# Patient Record
Sex: Female | Born: 1985
Health system: Southern US, Community
[De-identification: ages and names within clinical notes are randomized; demographics above are authoritative.]

## PROBLEM LIST (undated history)

## (undated) DIAGNOSIS — J45909 Unspecified asthma, uncomplicated: Secondary | ICD-10-CM

## (undated) DIAGNOSIS — D649 Anemia, unspecified: Secondary | ICD-10-CM

## (undated) DIAGNOSIS — F419 Anxiety disorder, unspecified: Secondary | ICD-10-CM

## (undated) DIAGNOSIS — M797 Fibromyalgia: Secondary | ICD-10-CM

## (undated) DIAGNOSIS — E079 Disorder of thyroid, unspecified: Secondary | ICD-10-CM

## (undated) DIAGNOSIS — Z8742 Personal history of other diseases of the female genital tract: Secondary | ICD-10-CM

## (undated) HISTORY — DX: Unspecified asthma, uncomplicated: J45.909

## (undated) HISTORY — DX: Personal history of other diseases of the female genital tract: Z87.42

## (undated) HISTORY — PX: APPENDECTOMY: SHX54

## (undated) HISTORY — PX: ABDOMINAL HYSTERECTOMY: SHX81

## (undated) HISTORY — DX: Fibromyalgia: M79.7

## (undated) HISTORY — PX: CHOLECYSTECTOMY: SHX55

## (undated) HISTORY — DX: Anxiety disorder, unspecified: F41.9

## (undated) HISTORY — PX: TONSILLECTOMY AND ADENOIDECTOMY: SUR1326

## (undated) HISTORY — PX: ANKLE SURGERY: SHX546

## (undated) HISTORY — DX: Anemia, unspecified: D64.9

## (undated) HISTORY — DX: Disorder of thyroid, unspecified: E07.9

---

## 2015-09-04 NOTE — L&D Delivery Note (Signed)
Delivery Note At 1:01 PM a viable and healthy female was delivered via Vaginal, Spontaneous Delivery (Presentation: direct OA).  Shoulder dystocia lasting approximately 40 seconds resolved with McRobert's and modified Woods;  APGAR: 9, 9; weight:  9lb 2 oz .   Placenta status: intact ,   Anesthesia:  Epidural Episiotomy: None Lacerations: 2nd degree Suture Repair: 3.0 Monocryl Est. Blood Loss (mL):  400  Mom to postpartum.  Baby to Couplet care / Skin to Skin.  Eino FarberWalidah Elenora FenderKarim 08/27/2016, 1:48 PM

## 2015-09-19 DIAGNOSIS — M62838 Other muscle spasm: Secondary | ICD-10-CM | POA: Insufficient documentation

## 2015-09-19 DIAGNOSIS — M797 Fibromyalgia: Secondary | ICD-10-CM | POA: Insufficient documentation

## 2015-09-23 ENCOUNTER — Telehealth: Payer: Self-pay | Admitting: Endocrinology

## 2015-09-23 ENCOUNTER — Ambulatory Visit (INDEPENDENT_AMBULATORY_CARE_PROVIDER_SITE_OTHER): Payer: BLUE CROSS/BLUE SHIELD | Admitting: Endocrinology

## 2015-09-23 ENCOUNTER — Encounter: Payer: Self-pay | Admitting: Endocrinology

## 2015-09-23 VITALS — BP 136/94 | HR 99 | Temp 98.4°F | Ht 64.5 in | Wt 243.0 lb

## 2015-09-23 DIAGNOSIS — E282 Polycystic ovarian syndrome: Secondary | ICD-10-CM | POA: Diagnosis not present

## 2015-09-23 DIAGNOSIS — E063 Autoimmune thyroiditis: Secondary | ICD-10-CM | POA: Diagnosis not present

## 2015-09-23 MED ORDER — LIRAGLUTIDE 18 MG/3ML ~~LOC~~ SOPN: 1.8000 mg | PEN_INJECTOR | Freq: Every day | SUBCUTANEOUS | Status: DC

## 2015-09-23 MED ORDER — METFORMIN HCL ER 500 MG PO TB24: 500.0000 mg | ORAL_TABLET | Freq: Every day | ORAL | Status: DC

## 2015-09-23 NOTE — Patient Instructions (Addendum)
Please sign a release of information from your previous endocrinologist.   If necessary, we can request another thyroid ultrasound.  Because of the Hasimoto's condition, you are at risk for abnormal thyroid blood tests, so you should have checked periodically.  In view of your medical condition, you should avoid pregnancy until we have decided it is safe You should take "victoza" pen, once a day.  The side-effect is nausea, which goes away with time.  To avoid this side-effect, start with the lowest (0.6) setting.  After a few days, increase to 1.2.  If you still have little or no nausea, increase to the highest (1.8) setting, and continue that setting.  Here is a discount card.   Other options are invokana and/or metformin.  We can consider these as we need to.   Please come back for a follow-up appointment in 3 months.

## 2015-09-23 NOTE — Progress Notes (Signed)
Subjective:    Patient ID: Shannon Mckee, female    DOB: [birth year], 30 y.o.   MRN: [id number]  HPI Pt had menarche at age 57.  She has usually had irregular menses over the years. She is G2P2 (2013 (had GDM) and 2016--did not require induction of ovulation either time).  She does not want a pregnancy now, but would like to preserve fertility for the future.  She has tried many types of oral contraceptives, and did not tolerate.  She took depo-provera as a teenager, but stopped due to severe weight gain.  She has mild acne on the face, but no assoc hirsutism. She was started on provera a few days ago.   No past medical history on file.  No past surgical history on file.  Social History   Social History  . Marital Status: Married    Spouse Name: N/A  . Number of Children: N/A  . Years of Education: N/A   Occupational History  . Not on file.   Social History Main Topics  . Smoking status: Never Smoker   . Smokeless tobacco: Not on file  . Alcohol Use: No  . Drug Use: Not on file  . Sexual Activity: Not on file   Other Topics Concern  . Not on file   Social History Narrative  . No narrative on file    No current outpatient prescriptions on file prior to visit.   No current facility-administered medications on file prior to visit.    Allergies  Allergen Reactions  . Aspirin Palpitations  . Cefixime Anaphylaxis    Unknown reaction  . Sulfa Antibiotics Hives and Rash    Palpitations  . Latex     Hive Palpitations     No family history on file.  BP 136/94 mmHg  Pulse 99  Temp(Src) 98.4 F (36.9 C) (Oral)  Ht 5' 4.5" (1.638 m)  Wt 243 lb (110.224 kg)  BMI 41.08 kg/m2  SpO2 98%  LMP 07/05/2015  Review of Systems denies hyperpigmentation, cramps, and muscle weakness.  She reports alternating heat and cold intolerance, lightheadedness, chest pain, myalgias, mood swings, headache, fatigue, blurry vision, excessive diaphoresis, hair loss, doe, urinary frequency,  acral numbness, easy bruising, depression, and palpitations.      Objective:   Physical Exam VS: see vs page GEN: no distress HEAD: head: no deformity eyes: no periorbital swelling, no proptosis external nose and ears are normal mouth: no lesion seen NECK: supple, thyroid is not enlarged CHEST WALL: no deformity LUNGS:  Clear to auscultation CV: reg rate and rhythm, no murmur.  ABD: abdomen is soft, nontender.  no hepatosplenomegaly.  not distended.  no hernia.   MUSCULOSKELETAL: muscle bulk and strength are grossly normal.  no obvious joint swelling.  gait is normal and steady EXTEMITIES: no deformity.  no ulcer on the feet.  feet are of normal color and temp.  no edema PULSES: dorsalis pedis intact bilat.  no carotid bruit NEURO:  cn 2-12 grossly intact.   readily moves all 4's.  sensation is intact to touch on the feet SKIN:  Normal texture and temperature.  No rash or suspicious lesion is visible.   NODES:  None palpable at the neck PSYCH: alert, well-oriented.  Does not appear anxious nor depressed.    outside test results are reviewed: TSH=2  I have reviewed outside records, and summarized: Pt was noted to have PCO, and referred here.     Assessment & Plan:  Hasimoto's thyroiditis, new to  me.  PCO: she is at risk for DM.  Thyroid nodule,reported by patient.   Patient is advised the following: Patient Instructions  Please sign a release of information from your previous endocrinologist.   If necessary, we can request another thyroid ultrasound.  Because of the Hasimoto's condition, you are at risk for abnormal thyroid blood tests, so you should have checked periodically.  In view of your medical condition, you should avoid pregnancy until we have decided it is safe You should take "victoza" pen, once a day.  The side-effect is nausea, which goes away with time.  To avoid this side-effect, start with the lowest (0.6) setting.  After a few days, increase to 1.2.  If you  still have little or no nausea, increase to the highest (1.8) setting, and continue that setting.  Here is a discount card.   Other options are invokana and/or metformin.  We can consider these as we need to.   Please come back for a follow-up appointment in 3 months.

## 2015-09-23 NOTE — Telephone Encounter (Signed)
please call patient: Ins declined victoza. Instead, i have sent a prescription to your pharmacy, for metformin Please come back for a follow-up appointment in 3 months

## 2015-09-23 NOTE — Telephone Encounter (Signed)
Left a voicemail advising of note below. Requested a call back if the pt would like to discuss.  

## 2015-12-22 ENCOUNTER — Ambulatory Visit: Payer: BLUE CROSS/BLUE SHIELD | Admitting: Endocrinology

## 2015-12-29 ENCOUNTER — Encounter: Payer: Self-pay | Admitting: Certified Nurse Midwife

## 2015-12-29 ENCOUNTER — Ambulatory Visit (INDEPENDENT_AMBULATORY_CARE_PROVIDER_SITE_OTHER): Payer: Medicaid Other | Admitting: Certified Nurse Midwife

## 2015-12-29 VITALS — BP 134/81 | HR 97 | Wt 244.0 lb

## 2015-12-29 DIAGNOSIS — Z01419 Encounter for gynecological examination (general) (routine) without abnormal findings: Secondary | ICD-10-CM

## 2015-12-29 DIAGNOSIS — Z0000 Encounter for general adult medical examination without abnormal findings: Secondary | ICD-10-CM | POA: Diagnosis not present

## 2015-12-29 DIAGNOSIS — Z3202 Encounter for pregnancy test, result negative: Secondary | ICD-10-CM | POA: Diagnosis not present

## 2015-12-29 DIAGNOSIS — Z1389 Encounter for screening for other disorder: Secondary | ICD-10-CM | POA: Diagnosis not present

## 2015-12-29 DIAGNOSIS — N926 Irregular menstruation, unspecified: Secondary | ICD-10-CM

## 2015-12-29 DIAGNOSIS — E063 Autoimmune thyroiditis: Secondary | ICD-10-CM

## 2015-12-29 DIAGNOSIS — Z3481 Encounter for supervision of other normal pregnancy, first trimester: Secondary | ICD-10-CM

## 2015-12-29 DIAGNOSIS — Z7689 Persons encountering health services in other specified circumstances: Secondary | ICD-10-CM

## 2015-12-29 LAB — POCT URINALYSIS DIPSTICK
Bilirubin, UA: NEGATIVE
Glucose, UA: NEGATIVE
Ketones, UA: NEGATIVE
Nitrite, UA: NEGATIVE
PH UA: 6
PROTEIN UA: NEGATIVE
RBC UA: NEGATIVE
SPEC GRAV UA: 1.015
UROBILINOGEN UA: NEGATIVE

## 2015-12-29 LAB — POCT URINE PREGNANCY: PREG TEST UR: POSITIVE — AB

## 2015-12-29 NOTE — Progress Notes (Signed)
Patient ID: Shannon Mckee, female   DOB: 1985-09-19, 30 y.o.   MRN: [id number]    Subjective:      Shannon Mckee is a 30 y.o. female here for a routine exam.  Current complaints: recent pregnancy.  Recently moved from [place].  Husband recently changed jobs.  Thinks she is about [redacted] weeks pregnant.  LMP: March 8th.    Personal health questionnaire:  Is patient Ashkenazi Jewish, have a family history of breast and/or ovarian cancer: no Is there a family history of uterine cancer diagnosed at age < 62, gastrointestinal cancer, urinary tract cancer, family member who is a Personnel officer syndrome-associated carrier: no Is the patient overweight and hypertensive, family history of diabetes, personal history of gestational diabetes, preeclampsia or PCOS: yes Is patient over 75, have PCOS,  family history of premature CHD under age 44, diabetes, smoke, have hypertension or peripheral artery disease:  yes At any time, has a partner hit, kicked or otherwise hurt or frightened you?: no Over the past 2 weeks, have you felt down, depressed or hopeless?: no, struggled in the past Over the past 2 weeks, have you felt little interest or pleasure in doing things?:no   Gynecologic History No LMP recorded. Patient is not currently having periods (Reason: Other). Contraception: none Last Pap: Summer 2016. Results were: normal according to the patient Last mammogram: N/A.   Obstetric History OB History  No data available  G3P2, term.    Past Medical History  Diagnosis Date  . Anemia     Pregnancy related  . Anxiety   . Asthma   . History of PCOS   . Fibromyalgia   . Thyroid disease     Past Surgical History  Procedure Laterality Date  . Cholecystectomy    . Appendectomy    . Tonsillectomy and adenoidectomy    . Ankle surgery      x 2      Current outpatient prescriptions:  .  ibuprofen (ADVIL,MOTRIN) 400 MG tablet, Take 400 mg by mouth every 6 (six) hours as needed., Disp: , Rfl:  .  Vitamin D,  Ergocalciferol, (DRISDOL) 50000 units CAPS capsule, TK ONE C PO  WEEKLY, Disp: , Rfl: 0 Allergies  Allergen Reactions  . Aspirin Palpitations  . Cefixime Anaphylaxis    Unknown reaction  . Sulfa Antibiotics Hives and Rash    Palpitations  . Latex     Hive Palpitations     Social History  Substance Use Topics  . Smoking status: Never Smoker   . Smokeless tobacco: Not on file  . Alcohol Use: No    History reviewed. No pertinent family history.    Review of Systems  Constitutional: negative for fatigue and weight loss Respiratory: negative for cough and wheezing Cardiovascular: negative for chest pain, fatigue and palpitations Gastrointestinal: negative for abdominal pain and change in bowel habits Musculoskeletal:negative for myalgias Neurological: negative for gait problems and tremors Behavioral/Psych: negative for abusive relationship, depression Endocrine: negative for temperature intolerance   Genitourinary:negative for abnormal menstrual periods, genital lesions, hot flashes, sexual problems and vaginal discharge Integument/breast: negative for breast lump, breast tenderness, nipple discharge and skin lesion(s)    Objective:       BP 134/81 mmHg  Pulse 97  Wt 244 lb (110.678 kg) General:   alert  Skin:   no rash or abnormalities  Lungs:   clear to auscultation bilaterally  Heart:   regular rate and rhythm, S1, S2 normal, no murmur, click, rub or gallop  Breasts:  normal without suspicious masses, skin or nipple changes or axillary nodes  Abdomen:  normal findings: no organomegaly, soft, non-tender and no hernia  Pelvis:  External genitalia: normal general appearance Urinary system: urethral meatus normal and bladder without fullness, nontender Vaginal: normal without tenderness, induration or masses Cervix: normal appearance Adnexa: normal bimanual exam Uterus: anteverted and non-tender, about 7 weeks in size   Lab Review Urine pregnancy test Labs reviewed  yes Radiologic studies reviewed no  50% of 30 min visit spent on counseling and coordination of care.   Assessment:    Healthy female exam.   +UPT: roughly [redacted] weeks pregnant  H/O Hashimoto's thyroiditis  Obesity  Close spaced pregnancies  H/O PCOS  Plan:    Education reviewed: calcium supplements, depression evaluation, low fat, low cholesterol diet, safe sex/STD prevention, self breast exams, skin cancer screening and weight bearing exercise. Contraception: none. Follow up in: 3 weeks for NOB appointment.   No orders of the defined types were placed in this encounter.   Orders Placed This Encounter  Procedures  . US MFM Fetal Nuchal Translucency    Standing Status: Future     Number of Occurrences:      Standing Expiration Date: 02/27/2017    Scheduling Instructions:     Roughly 7 weeks today.    Order Specific Question:  Reason for Exam (SYMPTOM  OR DIAGNOSIS REQUIRED)    Answer:  hashimotos thyroditis, PCOS    Order Specific Question:  Preferred imaging location?    Answer:  [facility]  . Ambulatory referral to Endocrinology    Referral Priority:  Routine    Referral Type:  Consultation    Referral Reason:  Specialty Services Required    Number of Visits Requested:  1  . POCT urinalysis dipstick  . POCT urine pregnancy   Need to obtain previous records

## 2015-12-29 NOTE — Addendum Note (Signed)
Addended by: Marya LandryFOSTER, SUZANNE D on: 12/29/2015 02:36 PM   Modules accepted: Orders

## 2016-01-01 LAB — PAP IG W/ RFLX HPV ASCU: PAP SMEAR COMMENT: 0

## 2016-01-01 LAB — NUSWAB VAGINITIS PLUS (VG+)
Atopobium vaginae: HIGH Score — AB
CANDIDA ALBICANS, NAA: NEGATIVE
CANDIDA GLABRATA, NAA: NEGATIVE
Chlamydia trachomatis, NAA: NEGATIVE
Megasphaera 1: HIGH Score — AB
Neisseria gonorrhoeae, NAA: NEGATIVE
TRICH VAG BY NAA: NEGATIVE

## 2016-01-03 ENCOUNTER — Other Ambulatory Visit: Payer: Self-pay | Admitting: Certified Nurse Midwife

## 2016-01-03 DIAGNOSIS — N760 Acute vaginitis: Principal | ICD-10-CM

## 2016-01-03 DIAGNOSIS — B9689 Other specified bacterial agents as the cause of diseases classified elsewhere: Secondary | ICD-10-CM

## 2016-01-03 MED ORDER — METRONIDAZOLE 500 MG PO TABS: 500.0000 mg | ORAL_TABLET | Freq: Two times a day (BID) | ORAL | Status: DC

## 2016-01-16 ENCOUNTER — Encounter: Payer: Self-pay | Admitting: Endocrinology

## 2016-01-16 ENCOUNTER — Ambulatory Visit (INDEPENDENT_AMBULATORY_CARE_PROVIDER_SITE_OTHER): Payer: BLUE CROSS/BLUE SHIELD | Admitting: Endocrinology

## 2016-01-16 VITALS — BP 138/82 | HR 70 | Temp 98.5°F | Ht 64.5 in | Wt 243.0 lb

## 2016-01-16 DIAGNOSIS — E559 Vitamin D deficiency, unspecified: Secondary | ICD-10-CM | POA: Diagnosis not present

## 2016-01-16 DIAGNOSIS — E063 Autoimmune thyroiditis: Secondary | ICD-10-CM | POA: Diagnosis not present

## 2016-01-16 DIAGNOSIS — E041 Nontoxic single thyroid nodule: Secondary | ICD-10-CM | POA: Diagnosis not present

## 2016-01-16 DIAGNOSIS — E282 Polycystic ovarian syndrome: Secondary | ICD-10-CM | POA: Diagnosis not present

## 2016-01-16 LAB — HEMOGLOBIN A1C: HEMOGLOBIN A1C: 5.4 % (ref 4.6–6.5)

## 2016-01-16 LAB — TSH: TSH: 1.92 u[IU]/mL (ref 0.35–4.50)

## 2016-01-16 LAB — VITAMIN D 25 HYDROXY (VIT D DEFICIENCY, FRACTURES): VITD: 26.58 ng/mL — ABNORMAL LOW (ref 30.00–100.00)

## 2016-01-16 NOTE — Progress Notes (Signed)
Subjective:    Patient ID: Shannon DecampYolanda Mckee, female    DOB: 26-Apr-1986, 30 y.o.   MRN: [id number]  HPI  The state of at least three ongoing medical problems is addressed today, with interval history of each noted here: Pt returns for f/u chronic primary hypothyroidism: She took synthroid during a 2015-2016 pregnancy.  She has slight dry skin. Pt also has PCO (she is G2P2 (2013 (had GDM) and 2016--she did not require induction of ovulation either time).  She is now [redacted] weeks pregnant.  She has nausea. She was also noted to have a thyroid nodule in the past: was too small to require bx.  Denies neck swelling.  Past Medical History  Diagnosis Date  . Anemia     Pregnancy related  . Anxiety   . Asthma   . History of PCOS   . Fibromyalgia   . Thyroid disease     Past Surgical History  Procedure Laterality Date  . Cholecystectomy    . Appendectomy    . Tonsillectomy and adenoidectomy    . Ankle surgery      x 2     Social History   Social History  . Marital Status: Married    Spouse Name: N/A  . Number of Children: N/A  . Years of Education: N/A   Occupational History  . Not on file.   Social History Main Topics  . Smoking status: Never Smoker   . Smokeless tobacco: Not on file  . Alcohol Use: No  . Drug Use: Not on file  . Sexual Activity: Yes   Other Topics Concern  . Not on file   Social History Narrative    Current Outpatient Prescriptions on File Prior to Visit  Medication Sig Dispense Refill  . Vitamin D, Ergocalciferol, (DRISDOL) 50000 units CAPS capsule TK ONE C PO  WEEKLY  0   No current facility-administered medications on file prior to visit.    Allergies  Allergen Reactions  . Aspirin Palpitations  . Cefixime Anaphylaxis    Unknown reaction  . Sulfa Antibiotics Hives and Rash    Palpitations  . Latex     Hive Palpitations     No family history on file.  BP 138/82 mmHg  Pulse 70  Temp(Src) 98.5 F (36.9 C) (Oral)  Ht 5' 4.5" (1.638 m)   Wt 243 lb (110.224 kg)  BMI 41.08 kg/m2  SpO2 95%     Review of Systems Denies vomiting and weight change.      Objective:   Physical Exam VITAL SIGNS:  See vs page GENERAL: no distress NECK: No thyroid nodule is palpable.  No palpable lymphadenopathy at the anterior neck.   Lab Results  Component Value Date   TSH 1.92 01/16/2016   Lab Results  Component Value Date   HGBA1C 5.4 01/16/2016      Assessment & Plan:  Hypothyroidism: no rx is needed now. PCO: she is at risk for GDM, but glucose is good now. Thyroid nodule: due for recheck.   Patient is advised the following: Patient Instructions  Let's recheck the thyroid ultrasound. you will receive a phone call, about a day and time for an appointment Because of the Hasimoto's condition, you are at risk for abnormal thyroid blood tests, so you should have checked periodically.  blood tests are requested for you today.  We'll let you know about the results.   Please come back for a follow-up appointment in 3 months.

## 2016-01-16 NOTE — Patient Instructions (Addendum)
Let's recheck the thyroid ultrasound. you will receive a phone call, about a day and time for an appointment Because of the Hasimoto's condition, you are at risk for abnormal thyroid blood tests, so you should have checked periodically.  blood tests are requested for you today.  We'll let you know about the results.   Please come back for a follow-up appointment in 3 months.

## 2016-01-19 ENCOUNTER — Encounter: Payer: Self-pay | Admitting: Certified Nurse Midwife

## 2016-01-19 ENCOUNTER — Ambulatory Visit (INDEPENDENT_AMBULATORY_CARE_PROVIDER_SITE_OTHER): Payer: Medicaid Other | Admitting: Certified Nurse Midwife

## 2016-01-19 VITALS — BP 125/83 | HR 92 | Wt 241.0 lb

## 2016-01-19 DIAGNOSIS — E063 Autoimmune thyroiditis: Secondary | ICD-10-CM

## 2016-01-19 DIAGNOSIS — O219 Vomiting of pregnancy, unspecified: Secondary | ICD-10-CM

## 2016-01-19 DIAGNOSIS — O0990 Supervision of high risk pregnancy, unspecified, unspecified trimester: Secondary | ICD-10-CM | POA: Insufficient documentation

## 2016-01-19 DIAGNOSIS — O0991 Supervision of high risk pregnancy, unspecified, first trimester: Secondary | ICD-10-CM

## 2016-01-19 MED ORDER — DOXYLAMINE-PYRIDOXINE 10-10 MG PO TBEC: DELAYED_RELEASE_TABLET | ORAL | Status: DC

## 2016-01-19 NOTE — Progress Notes (Signed)
Subjective:    Shannon Mckee is being seen today for her first obstetrical visit.  This is not a planned pregnancy. She is at 8344w1d gestation. Her obstetrical history is significant for obesity and Hashimotos thyroiditis. Relationship with FOB: spouse, living together. Patient does intend to breast feed. Pregnancy history fully reviewed.  The information documented in the HPI was reviewed and verified.  Menstrual History: OB History    Gravida Para Term Preterm AB TAB SAB Ectopic Multiple Living   3 2        2       Menarche age: 30 years of age  Patient's last menstrual period was 11/09/2015 (lmp unknown).    Past Medical History  Diagnosis Date  . Anemia     Pregnancy related  . Anxiety   . Asthma   . History of PCOS   . Fibromyalgia   . Thyroid disease     Past Surgical History  Procedure Laterality Date  . Cholecystectomy    . Appendectomy    . Tonsillectomy and adenoidectomy    . Ankle surgery      x 2      (Not in a hospital admission) Allergies  Allergen Reactions  . Aspirin Palpitations  . Cefixime Anaphylaxis    Unknown reaction  . Sulfa Antibiotics Hives and Rash    Palpitations  . Latex     Hive Palpitations     Social History  Substance Use Topics  . Smoking status: Never Smoker   . Smokeless tobacco: Not on file  . Alcohol Use: No    No family history on file.   Review of Systems Constitutional: negative for weight loss Gastrointestinal: + for vomiting, + for nausea Genitourinary:negative for genital lesions and vaginal discharge and dysuria Musculoskeletal:negative for back pain Behavioral/Psych: negative for abusive relationship, depression, illegal drug usage and tobacco use    Objective:    BP 125/83 mmHg  Pulse 92  Wt 241 lb (109.317 kg)  LMP 11/09/2015 (LMP Unknown) General Appearance:    Alert, cooperative, no distress, appears stated age  Head:    Normocephalic, without obvious abnormality, atraumatic  Eyes:    PERRL,  conjunctiva/corneas clear, EOM's intact, fundi    benign, both eyes  Ears:    Normal TM's and external ear canals, both ears  Nose:   Nares normal, septum midline, mucosa normal, no drainage    or sinus tenderness  Throat:   Lips, mucosa, and tongue normal; teeth and gums normal  Neck:   Supple, symmetrical, trachea midline, no adenopathy;    thyroid:  no enlargement/tenderness/nodules; no carotid   bruit or JVD  Back:     Symmetric, no curvature, ROM normal, no CVA tenderness  Lungs:     Clear to auscultation bilaterally, respirations unlabored  Chest Wall:    No tenderness or deformity   Heart:    Regular rate and rhythm, S1 and S2 normal, no murmur, rub   or gallop  Breast Exam:    No tenderness, masses, or nipple abnormality  Abdomen:     Soft, non-tender, bowel sounds active all four quadrants,    no masses, no organomegaly  Genitalia:    Normal female without lesion, discharge or tenderness  Extremities:   Extremities normal, atraumatic, no cyanosis or edema  Pulses:   2+ and symmetric all extremities  Skin:   Skin color, texture, turgor normal, no rashes or lesions  Lymph nodes:   Cervical, supraclavicular, and axillary nodes normal  Neurologic:  CNII-XII intact, normal strength, sensation and reflexes    throughout      Lab Review Urine pregnancy test Labs reviewed yes Radiologic studies reviewed no Assessment:    Pregnancy at [redacted]w[redacted]d weeks   N&V in early pregnancy  Plan:   Prenatal vitamins.  Counseling provided regarding continued use of seat belts, cessation of alcohol consumption, smoking or use of illicit drugs; infection precautions i.e., influenza/TDAP immunizations, toxoplasmosis,CMV, parvovirus, listeria and varicella; workplace safety, exercise during pregnancy; routine dental care, safe medications, sexual activity, hot tubs, saunas, pools, travel, caffeine use, fish and methlymercury, potential toxins, hair treatments, varicose veins Weight gain recommendations  per IOM guidelines reviewed: underweight/BMI< 18.5--> gain 28 - 40 lbs; normal weight/BMI 18.5 - 24.9--> gain 25 - 35 lbs; overweight/BMI 25 - 29.9--> gain 15 - 25 lbs; obese/BMI >30->gain  11 - 20 lbs Problem list reviewed and updated. FIRST/CF mutation testing/NIPT/QUAD SCREEN/fragile X/Ashkenazi Jewish population testing/Spinal muscular atrophy discussed: ordered. Role of ultrasound in pregnancy discussed; fetal survey: ordered. Amniocentesis discussed: not indicated. VBAC calculator score: VBAC consent form provided No orders of the defined types were placed in this encounter.   Orders Placed This Encounter  Procedures  . Culture, OB Urine  . HIV antibody  . Hemoglobinopathy evaluation  . Varicella zoster antibody, IgG  . Prenatal Profile I  . MaterniT21 PLUS Core+SCA    Order Specific Question:  Is the patient insulin dependent?    Answer:  No    Order Specific Question:  Please enter gestational age. This should be expressed as weeks AND days, i.e. 16w 6d. Enter weeks here. Enter days in next question.    Answer:  56    Order Specific Question:  Please enter gestational age. This should be expressed as weeks AND days, i.e. 16w 6d. Enter days here. Enter weeks in previous question.    Answer:  1    Order Specific Question:  How was gestational age calculated?    Answer:  LMP    Order Specific Question:  Please give the date of LMP OR Ultrasound OR Estimated date of delivery.    Answer:  08/15/2016    Order Specific Question:  Number of Fetuses (Type of Pregnancy):    Answer:  1    Order Specific Question:  Indications for performing the test? (please choose all that apply):    Answer:  Routine screening    Order Specific Question:  Other Indications? (Y=Yes, N=No)    Answer:  Y    Order Specific Question:  Please specify other indications, if any:    Answer:  Hashimotos thyroditis    Order Specific Question:  If this is a repeat specimen, please indicate the reason:     Answer:  Not indicated    Order Specific Question:  Please specify the patient's race: (C=White/Caucasion, B=Black, I=Native American, A=Asian, H=Hispanic, O=Other, U=Unknown)    Answer:  U    Order Specific Question:  Donor Egg - indicate if the egg was obtained from in vitro fertilization.    Answer:  N    Order Specific Question:  Age of Egg Donor.    Answer:  76    Order Specific Question:  Prior Down Syndrome/ONTD screening during current pregnancy.    Answer:  N    Order Specific Question:  Prior First Trimester Testing    Answer:  N    Order Specific Question:  Prior Second Trimester Testing    Answer:  N    Order Specific Question:  Family History of Neural Tube Defects    Answer:  N    Order Specific Question:  Prior Pregnancy with Down Syndrome    Answer:  N    Order Specific Question:  Please give the patient's weight (in pounds)    Answer:  228  . AMB referral to maternal fetal medicine    Referral Priority:  Routine    Referral Type:  Consultation    Referral Reason:  Specialty Services Required    Number of Visits Requested:  1    Follow up in 4 weeks. 50% of 30 min visit spent on counseling and coordination of care.

## 2016-01-20 ENCOUNTER — Ambulatory Visit
Admission: RE | Admit: 2016-01-20 | Discharge: 2016-01-20 | Disposition: A | Discharge status: Home / Self Care | Payer: Medicaid Other | Source: Ambulatory Visit | Attending: Endocrinology | Admitting: Endocrinology

## 2016-01-20 DIAGNOSIS — E041 Nontoxic single thyroid nodule: Secondary | ICD-10-CM

## 2016-01-23 LAB — PRENATAL PROFILE I(LABCORP)
ANTIBODY SCREEN: NEGATIVE
BASOS ABS: 0 10*3/uL (ref 0.0–0.2)
Basos: 0 %
EOS (ABSOLUTE): 0.1 10*3/uL (ref 0.0–0.4)
EOS: 1 %
HEMATOCRIT: 39.2 % (ref 34.0–46.6)
HEMOGLOBIN: 12.9 g/dL (ref 11.1–15.9)
Hepatitis B Surface Ag: NEGATIVE
IMMATURE GRANS (ABS): 0 10*3/uL (ref 0.0–0.1)
IMMATURE GRANULOCYTES: 0 %
LYMPHS ABS: 2.6 10*3/uL (ref 0.7–3.1)
LYMPHS: 20 %
MCH: 27.9 pg (ref 26.6–33.0)
MCHC: 32.9 g/dL (ref 31.5–35.7)
MCV: 85 fL (ref 79–97)
MONOS ABS: 0.7 10*3/uL (ref 0.1–0.9)
Monocytes: 6 %
NEUTROS PCT: 73 %
Neutrophils Absolute: 9.6 10*3/uL — ABNORMAL HIGH (ref 1.4–7.0)
Platelets: 353 10*3/uL (ref 150–379)
RBC: 4.62 x10E6/uL (ref 3.77–5.28)
RDW: 13.7 % (ref 12.3–15.4)
RH TYPE: POSITIVE
RPR Ser Ql: NONREACTIVE
RUBELLA: 8.3 {index} (ref >0.99)
WBC: 12.9 10*3/uL — ABNORMAL HIGH (ref 3.4–10.8)

## 2016-01-23 LAB — HEMOGLOBINOPATHY EVALUATION
HEMOGLOBIN F QUANTITATION: 0 % (ref 0.0–2.0)
HGB A: 97.4 % (ref 94.0–98.0)
HGB C: 0 %
HGB S: 0 %
Hemoglobin A2 Quantitation: 2.6 % (ref 0.7–3.1)

## 2016-01-23 LAB — VARICELLA ZOSTER ANTIBODY, IGG: Varicella zoster IgG: 1957 index (ref >165)

## 2016-01-23 LAB — HIV ANTIBODY (ROUTINE TESTING W REFLEX): HIV SCREEN 4TH GENERATION: NONREACTIVE

## 2016-01-26 LAB — MATERNIT21 PLUS CORE+SCA
CHROMOSOME 13: NEGATIVE
Chromosome 18: NEGATIVE
Chromosome 21: NEGATIVE
PDF: 0
Y CHROMOSOME: DETECTED

## 2016-02-07 ENCOUNTER — Other Ambulatory Visit: Payer: Self-pay | Admitting: Certified Nurse Midwife

## 2016-02-07 ENCOUNTER — Encounter (HOSPITAL_COMMUNITY): Payer: Self-pay

## 2016-02-07 ENCOUNTER — Ambulatory Visit (HOSPITAL_COMMUNITY): Admission: RE | Admit: 2016-02-07 | Payer: Medicaid Other | Source: Ambulatory Visit

## 2016-02-07 ENCOUNTER — Ambulatory Visit (HOSPITAL_COMMUNITY)
Admission: RE | Admit: 2016-02-07 | Discharge: 2016-02-07 | Disposition: A | Discharge status: Home / Self Care | Payer: Medicaid Other | Source: Ambulatory Visit | Attending: Certified Nurse Midwife | Admitting: Certified Nurse Midwife

## 2016-02-07 DIAGNOSIS — O99281 Endocrine, nutritional and metabolic diseases complicating pregnancy, first trimester: Secondary | ICD-10-CM | POA: Diagnosis not present

## 2016-02-07 DIAGNOSIS — Z3A11 11 weeks gestation of pregnancy: Secondary | ICD-10-CM | POA: Diagnosis not present

## 2016-02-07 DIAGNOSIS — O99211 Obesity complicating pregnancy, first trimester: Secondary | ICD-10-CM | POA: Insufficient documentation

## 2016-02-07 DIAGNOSIS — E079 Disorder of thyroid, unspecified: Secondary | ICD-10-CM | POA: Insufficient documentation

## 2016-02-07 DIAGNOSIS — Z36 Encounter for antenatal screening of mother: Secondary | ICD-10-CM | POA: Diagnosis not present

## 2016-02-07 DIAGNOSIS — Z3481 Encounter for supervision of other normal pregnancy, first trimester: Secondary | ICD-10-CM

## 2016-02-09 ENCOUNTER — Ambulatory Visit (INDEPENDENT_AMBULATORY_CARE_PROVIDER_SITE_OTHER): Payer: Medicaid Other | Admitting: Certified Nurse Midwife

## 2016-02-09 VITALS — BP 128/76 | HR 86 | Temp 98.8°F | Wt 242.0 lb

## 2016-02-09 DIAGNOSIS — Z3482 Encounter for supervision of other normal pregnancy, second trimester: Secondary | ICD-10-CM

## 2016-02-09 DIAGNOSIS — Z3492 Encounter for supervision of normal pregnancy, unspecified, second trimester: Secondary | ICD-10-CM

## 2016-02-09 DIAGNOSIS — Z1389 Encounter for screening for other disorder: Secondary | ICD-10-CM | POA: Diagnosis not present

## 2016-02-09 DIAGNOSIS — Z331 Pregnant state, incidental: Secondary | ICD-10-CM | POA: Diagnosis not present

## 2016-02-09 LAB — POCT URINALYSIS DIPSTICK
Bilirubin, UA: NEGATIVE
Glucose, UA: NEGATIVE
Ketones, UA: NEGATIVE
NITRITE UA: NEGATIVE
PH UA: 6
Spec Grav, UA: 1.015
UROBILINOGEN UA: NEGATIVE

## 2016-02-09 NOTE — Progress Notes (Signed)
  Subjective:    Shannon Mckee is a 30 y.o. female being seen today for her obstetrical visit. She is at 2968w1d gestation. Patient reports: no complaints.  Problem List Items Addressed This Visit    None    Visit Diagnoses    Prenatal care, second trimester    -  Primary    Relevant Orders    POCT urinalysis dipstick (Completed)    Supervision of other normal pregnancy, antepartum, second trimester        Relevant Orders    Culture, OB Urine      Patient Active Problem List   Diagnosis Date Noted  . High-risk pregnancy in first trimester 01/19/2016  . Thyroid nodule 01/16/2016  . Vitamin D deficiency 01/16/2016  . Hashimoto's thyroiditis 09/23/2015  . PCO (polycystic ovaries) 09/23/2015    Objective:     BP 128/76 mmHg  Pulse 86  Temp(Src) 98.8 F (37.1 C)  Wt 242 lb (109.77 kg)  LMP 11/09/2015 (LMP Unknown) Uterine Size: Below umbilicus     Assessment:    Pregnancy @ 6968w1d  weeks Doing well    Plan:    Problem list reviewed and updated. Labs reviewed.  Follow up in 4 weeks. FIRST/CF mutation testing/NIPT/QUAD SCREEN/fragile X/Ashkenazi Jewish population testing/Spinal muscular atrophy discussed: results reviewed. Role of ultrasound in pregnancy discussed; fetal survey: requested. Amniocentesis discussed: not indicated. 50% of 15 minute visit spent on counseling and coordination of care.

## 2016-02-11 LAB — URINE CULTURE, OB REFLEX

## 2016-02-11 LAB — CULTURE, OB URINE

## 2016-03-08 ENCOUNTER — Ambulatory Visit (INDEPENDENT_AMBULATORY_CARE_PROVIDER_SITE_OTHER): Payer: Medicaid Other | Admitting: Certified Nurse Midwife

## 2016-03-08 ENCOUNTER — Encounter: Payer: Self-pay | Admitting: Certified Nurse Midwife

## 2016-03-08 VITALS — BP 121/79 | HR 90 | Wt 236.0 lb

## 2016-03-08 DIAGNOSIS — O219 Vomiting of pregnancy, unspecified: Secondary | ICD-10-CM

## 2016-03-08 DIAGNOSIS — Z3482 Encounter for supervision of other normal pregnancy, second trimester: Secondary | ICD-10-CM

## 2016-03-08 DIAGNOSIS — O0992 Supervision of high risk pregnancy, unspecified, second trimester: Secondary | ICD-10-CM

## 2016-03-08 LAB — POCT URINALYSIS DIPSTICK
Bilirubin, UA: NEGATIVE
Blood, UA: 50
Glucose, UA: NEGATIVE
NITRITE UA: NEGATIVE
PH UA: 5
PROTEIN UA: NEGATIVE
Spec Grav, UA: 1.025
UROBILINOGEN UA: NEGATIVE

## 2016-03-08 MED ORDER — ONDANSETRON HCL 4 MG PO TABS: 4.0000 mg | ORAL_TABLET | Freq: Every day | ORAL | Status: DC | PRN

## 2016-03-08 NOTE — Progress Notes (Signed)
  Subjective:    Shannon Mckee is a 30 y.o. female being seen today for her obstetrical visit. She is at 6362w5d gestation. Patient reports: fatigue, nausea, no bleeding, no contractions, no cramping, no leaking and lack of appetite, car sickness, trouble sleeping.  Reports eating small snacks & Carn[employer] breakfast shakes.  Reports having maternity support belt for future use.  Problem List Items Addressed This Visit    None    Visit Diagnoses    Encounter for supervision of other normal pregnancy in second trimester    -  Primary    Relevant Orders    POCT urinalysis dipstick    Supervision of high risk pregnancy, antepartum, second trimester        Relevant Orders    AMB referral to maternal fetal medicine    US MFM OB DETAIL +14 WK      Patient Active Problem List   Diagnosis Date Noted  . High-risk pregnancy in first trimester 01/19/2016  . Thyroid nodule 01/16/2016  . Vitamin D deficiency 01/16/2016  . Hashimoto's thyroiditis 09/23/2015  . PCO (polycystic ovaries) 09/23/2015    Objective:     BP 121/79 mmHg  Pulse 90  Wt 236 lb (107.049 kg)  LMP 11/09/2015 (LMP Unknown) Uterine Size: Below umbilicus   FHR = 140.  Assessment:    Pregnancy @ 3343w1d  weeks Doing well   Nausea in pregnancy. Plan:    Problem list reviewed and updated. Labs reviewed. Zofran for nausea. U/S @ MFM. Follow up in 4 weeks. FIRST/CF mutation testing/NIPT/QUAD SCREEN/fragile X/Ashkenazi Jewish population testing/Spinal muscular atrophy discussed: results reviewed. Role of ultrasound in pregnancy discussed; fetal survey: ordered. Amniocentesis discussed: not indicated. 50% of 15 minute visit spent on counseling and coordination of care.

## 2016-03-08 NOTE — Progress Notes (Signed)
I agree with note by NP Student Andrew Brake.  Was present for exam.  R.Lorenso Quirino CNM 

## 2016-03-13 LAB — URINE CULTURE, OB REFLEX

## 2016-03-13 LAB — CULTURE, OB URINE

## 2016-03-30 ENCOUNTER — Encounter (HOSPITAL_COMMUNITY): Payer: Self-pay

## 2016-03-30 ENCOUNTER — Other Ambulatory Visit: Payer: Self-pay | Admitting: Certified Nurse Midwife

## 2016-03-30 ENCOUNTER — Ambulatory Visit (INDEPENDENT_AMBULATORY_CARE_PROVIDER_SITE_OTHER): Payer: Medicaid Other | Admitting: Certified Nurse Midwife

## 2016-03-30 ENCOUNTER — Other Ambulatory Visit (HOSPITAL_COMMUNITY): Payer: Self-pay

## 2016-03-30 ENCOUNTER — Ambulatory Visit (HOSPITAL_COMMUNITY)
Admission: RE | Admit: 2016-03-30 | Discharge: 2016-03-30 | Disposition: A | Discharge status: Home / Self Care | Payer: Medicaid Other | Source: Ambulatory Visit | Attending: Certified Nurse Midwife | Admitting: Certified Nurse Midwife

## 2016-03-30 VITALS — BP 133/89 | HR 100 | Temp 98.7°F | Wt 237.0 lb

## 2016-03-30 DIAGNOSIS — Z331 Pregnant state, incidental: Secondary | ICD-10-CM

## 2016-03-30 DIAGNOSIS — O99212 Obesity complicating pregnancy, second trimester: Secondary | ICD-10-CM | POA: Diagnosis not present

## 2016-03-30 DIAGNOSIS — Z6841 Body Mass Index (BMI) 40.0 and over, adult: Secondary | ICD-10-CM | POA: Diagnosis not present

## 2016-03-30 DIAGNOSIS — O99282 Endocrine, nutritional and metabolic diseases complicating pregnancy, second trimester: Secondary | ICD-10-CM | POA: Insufficient documentation

## 2016-03-30 DIAGNOSIS — O0992 Supervision of high risk pregnancy, unspecified, second trimester: Secondary | ICD-10-CM

## 2016-03-30 DIAGNOSIS — E063 Autoimmune thyroiditis: Secondary | ICD-10-CM | POA: Diagnosis not present

## 2016-03-30 DIAGNOSIS — Z1389 Encounter for screening for other disorder: Secondary | ICD-10-CM

## 2016-03-30 DIAGNOSIS — Z363 Encounter for antenatal screening for malformations: Secondary | ICD-10-CM

## 2016-03-30 DIAGNOSIS — E0590 Thyrotoxicosis, unspecified without thyrotoxic crisis or storm: Secondary | ICD-10-CM

## 2016-03-30 DIAGNOSIS — Z36 Encounter for antenatal screening of mother: Secondary | ICD-10-CM | POA: Diagnosis not present

## 2016-03-30 DIAGNOSIS — Z3A18 18 weeks gestation of pregnancy: Secondary | ICD-10-CM | POA: Diagnosis not present

## 2016-03-30 DIAGNOSIS — Z3482 Encounter for supervision of other normal pregnancy, second trimester: Secondary | ICD-10-CM

## 2016-03-30 DIAGNOSIS — O99210 Obesity complicating pregnancy, unspecified trimester: Secondary | ICD-10-CM

## 2016-03-30 LAB — POCT URINALYSIS DIPSTICK
BILIRUBIN UA: NEGATIVE
Blood, UA: 50
Glucose, UA: NEGATIVE
Nitrite, UA: NEGATIVE
Protein, UA: NEGATIVE
Spec Grav, UA: 1.025
Urobilinogen, UA: NEGATIVE
pH, UA: 5

## 2016-03-30 NOTE — Progress Notes (Signed)
Pt states that she had u/s today.

## 2016-03-30 NOTE — Progress Notes (Signed)
Subjective:    Shannon Mckee is a 30 y.o. female being seen today for her obstetrical visit. She is at [redacted]w[redacted]d gestation. Patient reports: no complaints . Fetal movement: normal.  Problem List Items Addressed This Visit    None    Visit Diagnoses    Encounter for supervision of other normal pregnancy in second trimester    -  Primary   Relevant Orders   POCT urinalysis dipstick (Completed)   Supervision of high risk pregnancy in second trimester       Relevant Orders   Hemoglobin A1c   Culture, OB Urine     Patient Active Problem List   Diagnosis Date Noted  . High-risk pregnancy in first trimester 01/19/2016  . Thyroid nodule 01/16/2016  . Vitamin D deficiency 01/16/2016  . Hashimoto's thyroiditis 09/23/2015  . PCO (polycystic ovaries) 09/23/2015  . Fibromyalgia 09/19/2015  . Cervical paraspinal muscle spasm 09/19/2015   Objective:    BP 133/89   Pulse 100   Temp 98.7 F (37.1 C)   Wt 237 lb (107.5 kg)   LMP 11/09/2015 (LMP Unknown)   BMI 40.05 kg/m  FHT: 135 BPM  Uterine Size: size equals dates     Assessment:    Pregnancy @ [redacted]w[redacted]d    maternal body habitus  h/o hashimoto's thyroiditis: endocrinology consults ongoing  Plan:   HgBA1C today  OBGCT: discussed. Signs and symptoms of preterm labor: discussed.  Labs, problem list reviewed and updated 2 hr GTT planned Follow up in 4 weeks.

## 2016-03-30 NOTE — Progress Notes (Signed)
MFM consult, staff note  Morbid Obesity (BMI 41) and overweight (25 < BMI < 30) afflicts over 67% of Americans and is the fastest growing health problem in the [place]. Obesity in pregnancy is associated with an increased risk of both early fetal loss and stillbirth. In a [employer], the risk of stillbirth was 1/200 for non-obese mothers and increases to 1/100 for morbid obesity (BMI > 40). The reason for this increased still-birth risk with advancing gestation is likely multifactorial, but associated with placental dysfunction. The obese gravida is also at increased risk for preeclampsia, gestational or overt diabetes, other endocrinopathies (hypothyroidism), anesthetic complications related to sleep apnea, psychiatric disease including depression, risk for dysfunctional labor leading to a higher Cesarean delivery rate, and increased risk for thromboembolic disease.  ? Therefore, we recommend,evaluation: serial thyroid function testing q trimester and a screening HgbA1c.   Serial compression devices during labor/post-partum until ambulating and limited weight gain during gestation (11-20 pounds) should also be recommended. If possible ideal body weight should be achieved prior to gestation to reduce the risk of suboptimal outcomes. Given risk for preeclampsia, I recommend aspirin 81mg  po qd until [redacted] weeks gestation (discontinue 2 weeks prior to anticipated delivery). ? Summary of Recommendations: 1. Hashimoto's thyroiditis:  TSH q trimester (last was normal at 1.9); start synthroid if TSH >2.5 (note: patient is seeing endocrine q3 months so she is under excellent surveillance and no medication is currently indicated). 2. HgbA1c for screening.  If HgbA1c < 6.2% (threshold for diagnosis of DM), then obtain early 1 hour GTT. If 1 hour GTT is negative, then repeat at 24-28 weeks as per convention. 3. recommend aspirin 81 mg po qd (prevention of preeclampsia given patient's increased risk with  morbid obesity) 4. interval growth q4 weeks (morbid obesity--scheduled) 5. Antenatal testing from 36 weeks to delivery (morbid obesity)--may be weekly BPP 6. Anesthesia consult in the late trimester 7. Timed delivery at 39 weeks 8. 11-20 pound weight gain over pregnancy; may consider nutrition consult ? Time Spent: I spent in excess of 30 minutes in consultation with this patient to review records, evaluate her case, and provide her with an adequate discussion and education. More than 50% of this time was spent in direct face-to-face counseling.   Louann Sjogren Gaynelle Arabian, Louann Sjogren, MD, MS, FACOG Assistant Professor Section of Maternal-Fetal Medicine [facility]

## 2016-03-31 LAB — HEMOGLOBIN A1C
Est. average glucose Bld gHb Est-mCnc: 105 mg/dL
HEMOGLOBIN A1C: 5.3 % (ref 4.8–5.6)

## 2016-04-01 LAB — CULTURE, OB URINE

## 2016-04-01 LAB — URINE CULTURE, OB REFLEX

## 2016-04-17 ENCOUNTER — Ambulatory Visit (INDEPENDENT_AMBULATORY_CARE_PROVIDER_SITE_OTHER): Payer: Medicaid Other | Admitting: Endocrinology

## 2016-04-17 ENCOUNTER — Encounter: Payer: Self-pay | Admitting: Endocrinology

## 2016-04-17 VITALS — BP 126/73 | HR 78 | Resp 20 | Ht 64.5 in | Wt 240.4 lb

## 2016-04-17 DIAGNOSIS — E063 Autoimmune thyroiditis: Secondary | ICD-10-CM

## 2016-04-17 NOTE — Patient Instructions (Signed)
Thyroid blood tests are requested for you today.  We'll let you know about the results.  Please come back for a follow-up appointment in 3 months.  

## 2016-04-17 NOTE — Progress Notes (Signed)
Pre visit review using our clinic review tool, if applicable. No additional management support is needed unless otherwise documented below in the visit note. 

## 2016-04-17 NOTE — Progress Notes (Signed)
   Subjective:    Patient ID: Shannon Mckee, female    DOB: Sep 23, 1985, 30 y.o.   MRN: [id number]  HPI Pt returns for f/u chronic primary hypothyroidism: She took synthroid during a 2015-2016 pregnancy.  US showed tiny bilateral 0.4 cm hypoechoic thyroid nodules.  Pt also has PCO (she is G2P2 (2013 (had GDM) and 2016--she did not require induction of ovulation either time).  She is now [redacted] weeks pregnant.  pt states she feels well in general, except for headache.    Past Medical History:  Diagnosis Date  . Anemia    Pregnancy related  . Anxiety   . Asthma   . Fibromyalgia   . History of PCOS   . Thyroid disease     Past Surgical History:  Procedure Laterality Date  . ANKLE SURGERY     x 2   . APPENDECTOMY    . CHOLECYSTECTOMY    . TONSILLECTOMY AND ADENOIDECTOMY      Social History   Social History  . Marital status: Married    Spouse name: N/A  . Number of children: N/A  . Years of education: N/A   Occupational History  . Not on file.   Social History Main Topics  . Smoking status: Never Smoker  . Smokeless tobacco: Not on file  . Alcohol use No  . Drug use: Unknown  . Sexual activity: Yes   Other Topics Concern  . Not on file   Social History Narrative  . No narrative on file    Current Outpatient Prescriptions on File Prior to Visit  Medication Sig Dispense Refill  . ondansetron (ZOFRAN) 4 MG tablet Take 1 tablet (4 mg total) by mouth daily as needed. 30 tablet 1  . Prenatal Vit-Fe Fumarate-FA (PRENATAL MULTIVITAMIN) TABS tablet Take 1 tablet by mouth daily at 12 noon.    . Vitamin D, Ergocalciferol, (DRISDOL) 50000 units CAPS capsule TK ONE C PO  WEEKLY  0   No current facility-administered medications on file prior to visit.     Allergies  Allergen Reactions  . Aspirin Palpitations  . Cefixime Anaphylaxis    Unknown reaction  . Latex Hives    Hive Palpitations  Hive Palpitations   . Sulfa Antibiotics Hives and Rash     Palpitations Palpitations    No family history on file.  BP 126/73 (BP Location: Left Arm, Cuff Size: Large)   Pulse 78   Resp 20   Ht 5' 4.5" (1.638 m)   Wt 240 lb 6.4 oz (109 kg)   LMP 11/09/2015 (LMP Unknown)   BMI 40.63 kg/m   Review of Systems Sine the start of pregnancy, she has lost 4 lbs.      Objective:   Physical Exam VITAL SIGNS:  See vs page GENERAL: no distress NECK: There is no palpable thyroid enlargement.  No thyroid nodule is palpable.  No palpable lymphadenopathy at the anterior neck.  Lab Results  Component Value Date   TSH 1.65 04/17/2016      Assessment & Plan:  Hypothyroidism: has not yet recurred during this pregnancy.  We'll follow.

## 2016-04-18 LAB — T4, FREE: Free T4: 0.75 ng/dL (ref 0.60–1.60)

## 2016-04-18 LAB — TSH: TSH: 1.65 u[IU]/mL (ref 0.35–4.50)

## 2016-04-19 ENCOUNTER — Telehealth: Payer: Self-pay

## 2016-04-19 NOTE — Telephone Encounter (Signed)
Called and gave patient lab results, no questions or concerns at this time.

## 2016-04-27 ENCOUNTER — Encounter: Payer: Self-pay | Admitting: Certified Nurse Midwife

## 2016-04-27 ENCOUNTER — Ambulatory Visit (INDEPENDENT_AMBULATORY_CARE_PROVIDER_SITE_OTHER): Payer: Medicaid Other | Admitting: Certified Nurse Midwife

## 2016-04-27 VITALS — BP 138/84 | HR 91 | Temp 98.5°F | Wt 238.8 lb

## 2016-04-27 DIAGNOSIS — Z1389 Encounter for screening for other disorder: Secondary | ICD-10-CM | POA: Diagnosis not present

## 2016-04-27 DIAGNOSIS — Z3492 Encounter for supervision of normal pregnancy, unspecified, second trimester: Secondary | ICD-10-CM | POA: Diagnosis not present

## 2016-04-27 DIAGNOSIS — O0992 Supervision of high risk pregnancy, unspecified, second trimester: Secondary | ICD-10-CM | POA: Diagnosis not present

## 2016-04-27 DIAGNOSIS — Z331 Pregnant state, incidental: Secondary | ICD-10-CM

## 2016-04-27 LAB — POCT URINALYSIS DIPSTICK
BILIRUBIN UA: NEGATIVE
GLUCOSE UA: NEGATIVE
Ketones, UA: NEGATIVE
NITRITE UA: NEGATIVE
Protein, UA: NEGATIVE
RBC UA: NEGATIVE
Spec Grav, UA: 1.025
Urobilinogen, UA: 0.2
pH, UA: 5

## 2016-04-27 NOTE — Progress Notes (Signed)
Subjective:    Shannon DecampYolanda Mckee is a 30 y.o. female being seen today for her obstetrical visit. She is at 3354w6d gestation. Patient reports: no complaints . Fetal movement: normal.  Problem List Items Addressed This Visit      Other   Supervision of high risk pregnancy in second trimester    Other Visit Diagnoses    Prenatal care, second trimester    -  Primary   Relevant Orders   POCT Urinalysis Dipstick     Patient Active Problem List   Diagnosis Date Noted  . Supervision of high risk pregnancy in second trimester 01/19/2016  . Thyroid nodule 01/16/2016  . Vitamin D deficiency 01/16/2016  . Hashimoto's thyroiditis 09/23/2015  . PCO (polycystic ovaries) 09/23/2015  . Fibromyalgia 09/19/2015  . Cervical paraspinal muscle spasm 09/19/2015   Objective:    BP 138/84   Pulse 91   Temp 98.5 F (36.9 C)   Wt 238 lb 12.8 oz (108.3 kg)   LMP 11/09/2015 (LMP Unknown)   BMI 40.36 kg/m  FHT: 145 BPM  Uterine Size: 22-23 cm and size equals dates     Assessment:    Pregnancy @ 2154w6d    Morbid maternal obesity  Plan:    OBGCT: discussed and ordered for next visit. Signs and symptoms of preterm labor: discussed.  Labs, problem list reviewed and updated 2 hr GTT planned Follow up in 4 weeks with 2 hour OGTT.

## 2016-04-27 NOTE — Progress Notes (Signed)
Pt denies concerns at this time. 

## 2016-04-27 NOTE — Patient Instructions (Addendum)
Second Trimester of Pregnancy The second trimester is from week 13 through week 28, months 4 through 6. The second trimester is often a time when you feel your best. Your body has also adjusted to being pregnant, and you begin to feel better physically. Usually, morning sickness has lessened or quit completely, you may have more energy, and you may have an increase in appetite. The second trimester is also a time when the fetus is growing rapidly. At the end of the sixth month, the fetus is about 9 inches long and weighs about 1 pounds. You will likely begin to feel the baby move (quickening) between 18 and 20 weeks of the pregnancy. BODY CHANGES Your body goes through many changes during pregnancy. The changes vary from woman to woman.   Your weight will continue to increase. You will notice your lower abdomen bulging out.  You may begin to get stretch marks on your hips, abdomen, and breasts.  You may develop headaches that can be relieved by medicines approved by your health care provider.  You may urinate more often because the fetus is pressing on your bladder.  You may develop or continue to have heartburn as a result of your pregnancy.  You may develop constipation because certain hormones are causing the muscles that push waste through your intestines to slow down.  You may develop hemorrhoids or swollen, bulging veins (varicose veins).  You may have back pain because of the weight gain and pregnancy hormones relaxing your joints between the bones in your pelvis and as a result of a shift in weight and the muscles that support your balance.  Your breasts will continue to grow and be tender.  Your gums may bleed and may be sensitive to brushing and flossing.  Dark spots or blotches (chloasma, mask of pregnancy) may develop on your face. This will likely fade after the baby is born.  A dark line from your belly button to the pubic area (linea nigra) may appear. This will likely fade  after the baby is born.  You may have changes in your hair. These can include thickening of your hair, rapid growth, and changes in texture. Some women also have hair loss during or after pregnancy, or hair that feels dry or thin. Your hair will most likely return to normal after your baby is born. WHAT TO EXPECT AT YOUR PRENATAL VISITS During a routine prenatal visit:  You will be weighed to make sure you and the fetus are growing normally.  Your blood pressure will be taken.  Your abdomen will be measured to track your baby's growth.  The fetal heartbeat will be listened to.  Any test results from the previous visit will be discussed. Your health care provider may ask you:  How you are feeling.  If you are feeling the baby move.  If you have had any abnormal symptoms, such as leaking fluid, bleeding, severe headaches, or abdominal cramping.  If you are using any tobacco products, including cigarettes, chewing tobacco, and electronic cigarettes.  If you have any questions. Other tests that may be performed during your second trimester include:  Blood tests that check for:  Low iron levels (anemia).  Gestational diabetes (between 24 and 28 weeks).  Rh antibodies.  Urine tests to check for infections, diabetes, or protein in the urine.  An ultrasound to confirm the proper growth and development of the baby.  An amniocentesis to check for possible genetic problems.  Fetal screens for spina bifida   and Down syndrome.  HIV (human immunodeficiency virus) testing. Routine prenatal testing includes screening for HIV, unless you choose not to have this test. HOME CARE INSTRUCTIONS   Avoid all smoking, herbs, alcohol, and unprescribed drugs. These chemicals affect the formation and growth of the baby.  Do not use any tobacco products, including cigarettes, chewing tobacco, and electronic cigarettes. If you need help quitting, ask your health care provider. You may receive  counseling support and other resources to help you quit.  Follow your health care provider's instructions regarding medicine use. There are medicines that are either safe or unsafe to take during pregnancy.  Exercise only as directed by your health care provider. Experiencing uterine cramps is a good sign to stop exercising.  Continue to eat regular, healthy meals.  Wear a good support bra for breast tenderness.  Do not use hot tubs, steam rooms, or saunas.  Wear your seat belt at all times when driving.  Avoid raw meat, uncooked cheese, cat litter boxes, and soil used by cats. These carry germs that can cause birth defects in the baby.  Take your prenatal vitamins.  Take 1500-2000 mg of calcium daily starting at the 20th week of pregnancy until you deliver your baby.  Try taking a stool softener (if your health care provider approves) if you develop constipation. Eat more high-fiber foods, such as fresh vegetables or fruit and whole grains. Drink plenty of fluids to keep your urine clear or pale yellow.  Take warm sitz baths to soothe any pain or discomfort caused by hemorrhoids. Use hemorrhoid cream if your health care provider approves.  If you develop varicose veins, wear support hose. Elevate your feet for 15 minutes, 3-4 times a day. Limit salt in your diet.  Avoid heavy lifting, wear low heel shoes, and practice good posture.  Rest with your legs elevated if you have leg cramps or low back pain.  Visit your dentist if you have not gone yet during your pregnancy. Use a soft toothbrush to brush your teeth and be gentle when you floss.  A sexual relationship may be continued unless your health care provider directs you otherwise.  Continue to go to all your prenatal visits as directed by your health care provider. SEEK MEDICAL CARE IF:   You have dizziness.  You have mild pelvic cramps, pelvic pressure, or nagging pain in the abdominal area.  You have persistent nausea,  vomiting, or diarrhea.  You have a bad smelling vaginal discharge.  You have pain with urination. SEEK IMMEDIATE MEDICAL CARE IF:   You have a fever.  You are leaking fluid from your vagina.  You have spotting or bleeding from your vagina.  You have severe abdominal cramping or pain.  You have rapid weight gain or loss.  You have shortness of breath with chest pain.  You notice sudden or extreme swelling of your face, hands, ankles, feet, or legs.  You have not felt your baby move in over an hour.  You have severe headaches that do not go away with medicine.  You have vision changes.   This information is not intended to replace advice given to you by your health care provider. Make sure you discuss any questions you have with your health care provider.   Document Released: 08/14/2001 Document Revised: 09/10/2014 Document Reviewed: 10/21/2012 Elsevier Interactive Patient Education 2016 [employer] Preterm Labor Information Preterm labor is when labor starts at less than 37 weeks of pregnancy. The normal length of a pregnancy  is 39 to 41 weeks. CAUSES Often, there is no identifiable underlying cause as to why a woman goes into preterm labor. One of the most common known causes of preterm labor is infection. Infections of the uterus, cervix, vagina, amniotic sac, bladder, kidney, or even the lungs (pneumonia) can cause labor to start. Other suspected causes of preterm labor include:   Urogenital infections, such as yeast infections and bacterial vaginosis.   Uterine abnormalities (uterine shape, uterine septum, fibroids, or bleeding from the placenta).   A cervix that has been operated on (it may fail to stay closed).   Malformations in the fetus.   Multiple gestations (twins, triplets, and so on).   Breakage of the amniotic sac.  RISK FACTORS  Having a previous history of preterm labor.   Having premature rupture of membranes (PROM).   Having a placenta  that covers the opening of the cervix (placenta previa).   Having a placenta that separates from the uterus (placental abruption).   Having a cervix that is too weak to hold the fetus in the uterus (incompetent cervix).   Having too much fluid in the amniotic sac (polyhydramnios).   Taking illegal drugs or smoking while pregnant.   Not gaining enough weight while pregnant.   Being younger than 18 and older than 28 years old.   Having a low socioeconomic status.   Being African American. SYMPTOMS Signs and symptoms of preterm labor include:   Menstrual-like cramps, abdominal pain, or back pain.  Uterine contractions that are regular, as frequent as six in an hour, regardless of their intensity (may be mild or painful).  Contractions that start on the top of the uterus and spread down to the lower abdomen and back.   A sense of increased pelvic pressure.   A watery or bloody mucus discharge that comes from the vagina.  TREATMENT Depending on the length of the pregnancy and other circumstances, your health care provider may suggest bed rest. If necessary, there are medicines that can be given to stop contractions and to mature the fetal lungs. If labor happens before 34 weeks of pregnancy, a prolonged hospital stay may be recommended. Treatment depends on the condition of both you and the fetus.  WHAT SHOULD YOU DO IF YOU THINK YOU ARE IN PRETERM LABOR? Call your health care provider right away. You will need to go to the hospital to get checked immediately. HOW CAN YOU PREVENT PRETERM LABOR IN FUTURE PREGNANCIES? You should:   Stop smoking if you smoke.  Maintain healthy weight gain and avoid chemicals and drugs that are not necessary.  Be watchful for any type of infection.  Inform your health care provider if you have a known history of preterm labor.   This information is not intended to replace advice given to you by your health care provider. Make sure you  discuss any questions you have with your health care provider.   Document Released: 11/10/2003 Document Revised: 04/22/2013 Document Reviewed: 09/22/2012 Elsevier Interactive Patient Education 2016 [employer] Inc[employer]  

## 2016-05-11 ENCOUNTER — Ambulatory Visit (HOSPITAL_COMMUNITY): Payer: Medicaid Other

## 2016-05-18 ENCOUNTER — Encounter (HOSPITAL_COMMUNITY): Payer: Self-pay

## 2016-05-21 ENCOUNTER — Encounter (HOSPITAL_COMMUNITY): Payer: Self-pay

## 2016-05-21 ENCOUNTER — Ambulatory Visit (HOSPITAL_COMMUNITY)
Admission: RE | Admit: 2016-05-21 | Discharge: 2016-05-21 | Disposition: A | Discharge status: Home / Self Care | Payer: Medicaid Other | Source: Ambulatory Visit | Attending: Certified Nurse Midwife | Admitting: Certified Nurse Midwife

## 2016-05-21 DIAGNOSIS — O99212 Obesity complicating pregnancy, second trimester: Secondary | ICD-10-CM | POA: Insufficient documentation

## 2016-05-21 DIAGNOSIS — O99282 Endocrine, nutritional and metabolic diseases complicating pregnancy, second trimester: Secondary | ICD-10-CM | POA: Insufficient documentation

## 2016-05-21 DIAGNOSIS — Z3A26 26 weeks gestation of pregnancy: Secondary | ICD-10-CM | POA: Diagnosis not present

## 2016-05-21 DIAGNOSIS — Z36 Encounter for antenatal screening of mother: Secondary | ICD-10-CM | POA: Diagnosis not present

## 2016-05-21 DIAGNOSIS — E063 Autoimmune thyroiditis: Secondary | ICD-10-CM

## 2016-05-24 ENCOUNTER — Other Ambulatory Visit: Payer: Medicaid Other

## 2016-05-24 ENCOUNTER — Ambulatory Visit (INDEPENDENT_AMBULATORY_CARE_PROVIDER_SITE_OTHER): Payer: Medicaid Other | Admitting: Family Medicine

## 2016-05-24 VITALS — BP 127/81 | HR 93 | Temp 98.7°F | Wt 241.4 lb

## 2016-05-24 DIAGNOSIS — O0992 Supervision of high risk pregnancy, unspecified, second trimester: Secondary | ICD-10-CM

## 2016-05-24 DIAGNOSIS — E079 Disorder of thyroid, unspecified: Secondary | ICD-10-CM | POA: Insufficient documentation

## 2016-05-24 DIAGNOSIS — O99280 Endocrine, nutritional and metabolic diseases complicating pregnancy, unspecified trimester: Secondary | ICD-10-CM

## 2016-05-24 DIAGNOSIS — O99282 Endocrine, nutritional and metabolic diseases complicating pregnancy, second trimester: Secondary | ICD-10-CM | POA: Diagnosis not present

## 2016-05-24 DIAGNOSIS — Z3492 Encounter for supervision of normal pregnancy, unspecified, second trimester: Secondary | ICD-10-CM

## 2016-05-24 MED ORDER — TETANUS-DIPHTH-ACELL PERTUSSIS 5-2.5-18.5 LF-MCG/0.5 IM SUSP
0.5000 mL | Freq: Once | INTRAMUSCULAR | Status: AC
  Administered 2016-05-24: 0.5 mL via INTRAMUSCULAR

## 2016-05-24 NOTE — Patient Instructions (Signed)
Second Trimester of Pregnancy The second trimester is from week 13 through week 28, months 4 through 6. The second trimester is often a time when you feel your best. Your body has also adjusted to being pregnant, and you begin to feel better physically. Usually, morning sickness has lessened or quit completely, you may have more energy, and you may have an increase in appetite. The second trimester is also a time when the fetus is growing rapidly. At the end of the sixth month, the fetus is about 9 inches long and weighs about 1 pounds. You will likely begin to feel the baby move (quickening) between 18 and 20 weeks of the pregnancy. BODY CHANGES Your body goes through many changes during pregnancy. The changes vary from woman to woman.   Your weight will continue to increase. You will notice your lower abdomen bulging out.  You may begin to get stretch marks on your hips, abdomen, and breasts.  You may develop headaches that can be relieved by medicines approved by your health care provider.  You may urinate more often because the fetus is pressing on your bladder.  You may develop or continue to have heartburn as a result of your pregnancy.  You may develop constipation because certain hormones are causing the muscles that push waste through your intestines to slow down.  You may develop hemorrhoids or swollen, bulging veins (varicose veins).  You may have back pain because of the weight gain and pregnancy hormones relaxing your joints between the bones in your pelvis and as a result of a shift in weight and the muscles that support your balance.  Your breasts will continue to grow and be tender.  Your gums may bleed and may be sensitive to brushing and flossing.  Dark spots or blotches (chloasma, mask of pregnancy) may develop on your face. This will likely fade after the baby is born.  A dark line from your belly button to the pubic area (linea nigra) may appear. This will likely  fade after the baby is born.  You may have changes in your hair. These can include thickening of your hair, rapid growth, and changes in texture. Some women also have hair loss during or after pregnancy, or hair that feels dry or thin. Your hair will most likely return to normal after your baby is born. WHAT TO EXPECT AT YOUR PRENATAL VISITS During a routine prenatal visit:  You will be weighed to make sure you and the fetus are growing normally.  Your blood pressure will be taken.  Your abdomen will be measured to track your baby's growth.  The fetal heartbeat will be listened to.  Any test results from the previous visit will be discussed. Your health care provider may ask you:  How you are feeling.  If you are feeling the baby move.  If you have had any abnormal symptoms, such as leaking fluid, bleeding, severe headaches, or abdominal cramping.  If you are using any tobacco products, including cigarettes, chewing tobacco, and electronic cigarettes.  If you have any questions. Other tests that may be performed during your second trimester include:  Blood tests that check for:  Low iron levels (anemia).  Gestational diabetes (between 24 and 28 weeks).  Rh antibodies.  Urine tests to check for infections, diabetes, or protein in the urine.  An ultrasound to confirm the proper growth and development of the baby.  An amniocentesis to check for possible genetic problems.  Fetal screens for spina bifida   and Down syndrome.  HIV (human immunodeficiency virus) testing. Routine prenatal testing includes screening for HIV, unless you choose not to have this test. HOME CARE INSTRUCTIONS   Avoid all smoking, herbs, alcohol, and unprescribed drugs. These chemicals affect the formation and growth of the baby.  Do not use any tobacco products, including cigarettes, chewing tobacco, and electronic cigarettes. If you need help quitting, ask your health care provider. You may receive  counseling support and other resources to help you quit.  Follow your health care provider's instructions regarding medicine use. There are medicines that are either safe or unsafe to take during pregnancy.  Exercise only as directed by your health care provider. Experiencing uterine cramps is a good sign to stop exercising.  Continue to eat regular, healthy meals.  Wear a good support bra for breast tenderness.  Do not use hot tubs, steam rooms, or saunas.  Wear your seat belt at all times when driving.  Avoid raw meat, uncooked cheese, cat litter boxes, and soil used by cats. These carry germs that can cause birth defects in the baby.  Take your prenatal vitamins.  Take 1500-2000 mg of calcium daily starting at the 20th week of pregnancy until you deliver your baby.  Try taking a stool softener (if your health care provider approves) if you develop constipation. Eat more high-fiber foods, such as fresh vegetables or fruit and whole grains. Drink plenty of fluids to keep your urine clear or pale yellow.  Take warm sitz baths to soothe any pain or discomfort caused by hemorrhoids. Use hemorrhoid cream if your health care provider approves.  If you develop varicose veins, wear support hose. Elevate your feet for 15 minutes, 3-4 times a day. Limit salt in your diet.  Avoid heavy lifting, wear low heel shoes, and practice good posture.  Rest with your legs elevated if you have leg cramps or low back pain.  Visit your dentist if you have not gone yet during your pregnancy. Use a soft toothbrush to brush your teeth and be gentle when you floss.  A sexual relationship may be continued unless your health care provider directs you otherwise.  Continue to go to all your prenatal visits as directed by your health care provider. SEEK MEDICAL CARE IF:   You have dizziness.  You have mild pelvic cramps, pelvic pressure, or nagging pain in the abdominal area.  You have persistent nausea,  vomiting, or diarrhea.  You have a bad smelling vaginal discharge.  You have pain with urination. SEEK IMMEDIATE MEDICAL CARE IF:   You have a fever.  You are leaking fluid from your vagina.  You have spotting or bleeding from your vagina.  You have severe abdominal cramping or pain.  You have rapid weight gain or loss.  You have shortness of breath with chest pain.  You notice sudden or extreme swelling of your face, hands, ankles, feet, or legs.  You have not felt your baby move in over an hour.  You have severe headaches that do not go away with medicine.  You have vision changes.   This information is not intended to replace advice given to you by your health care provider. Make sure you discuss any questions you have with your health care provider.   Document Released: 08/14/2001 Document Revised: 09/10/2014 Document Reviewed: 10/21/2012 Elsevier Interactive Patient Education 2016 [employer]   Breastfeeding Deciding to breastfeed is one of the best choices you can make for you and your baby. A change   in hormones during pregnancy causes your breast tissue to grow and increases the number and size of your milk ducts. These hormones also allow proteins, sugars, and fats from your blood supply to make breast milk in your milk-producing glands. Hormones prevent breast milk from being released before your baby is born as well as prompt milk flow after birth. Once breastfeeding has begun, thoughts of your baby, as well as his or her sucking or crying, can stimulate the release of milk from your milk-producing glands.  BENEFITS OF BREASTFEEDING For Your Baby  Your first milk (colostrum) helps your baby's digestive system function better.  There are antibodies in your milk that help your baby fight off infections.  Your baby has a lower incidence of asthma, allergies, and sudden infant death syndrome.  The nutrients in breast milk are better for your baby than infant  formulas and are designed uniquely for your baby's needs.  Breast milk improves your baby's brain development.  Your baby is less likely to develop other conditions, such as childhood obesity, asthma, or type 2 diabetes mellitus. For You  Breastfeeding helps to create a very special bond between you and your baby.  Breastfeeding is convenient. Breast milk is always available at the correct temperature and costs nothing.  Breastfeeding helps to burn calories and helps you lose the weight gained during pregnancy.  Breastfeeding makes your uterus contract to its prepregnancy size faster and slows bleeding (lochia) after you give birth.   Breastfeeding helps to lower your risk of developing type 2 diabetes mellitus, osteoporosis, and breast or ovarian cancer later in life. SIGNS THAT YOUR BABY IS HUNGRY Early Signs of Hunger  Increased alertness or activity.  Stretching.  Movement of the head from side to side.  Movement of the head and opening of the mouth when the corner of the mouth or cheek is stroked (rooting).  Increased sucking sounds, smacking lips, cooing, sighing, or squeaking.  Hand-to-mouth movements.  Increased sucking of fingers or hands. Late Signs of Hunger  Fussing.  Intermittent crying. Extreme Signs of Hunger Signs of extreme hunger will require calming and consoling before your baby will be able to breastfeed successfully. Do not wait for the following signs of extreme hunger to occur before you initiate breastfeeding:  Restlessness.  A loud, strong cry.  Screaming. BREASTFEEDING BASICS Breastfeeding Initiation  Find a comfortable place to sit or lie down, with your neck and back well supported.  Place a pillow or rolled up blanket under your baby to bring him or her to the level of your breast (if you are seated). Nursing pillows are specially designed to help support your arms and your baby while you breastfeed.  Make sure that your baby's  abdomen is facing your abdomen.  Gently massage your breast. With your fingertips, massage from your chest wall toward your nipple in a circular motion. This encourages milk flow. You may need to continue this action during the feeding if your milk flows slowly.  Support your breast with 4 fingers underneath and your thumb above your nipple. Make sure your fingers are well away from your nipple and your baby's mouth.  Stroke your baby's lips gently with your finger or nipple.  When your baby's mouth is open wide enough, quickly bring your baby to your breast, placing your entire nipple and as much of the colored area around your nipple (areola) as possible into your baby's mouth.  More areola should be visible above your baby's upper lip than   below the lower lip.  Your baby's tongue should be between his or her lower gum and your breast.  Ensure that your baby's mouth is correctly positioned around your nipple (latched). Your baby's lips should create a seal on your breast and be turned out (everted).  It is common for your baby to suck about 2-3 minutes in order to start the flow of breast milk. Latching Teaching your baby how to latch on to your breast properly is very important. An improper latch can cause nipple pain and decreased milk supply for you and poor weight gain in your baby. Also, if your baby is not latched onto your nipple properly, he or she may swallow some air during feeding. This can make your baby fussy. Burping your baby when you switch breasts during the feeding can help to get rid of the air. However, teaching your baby to latch on properly is still the best way to prevent fussiness from swallowing air while breastfeeding. Signs that your baby has successfully latched on to your nipple:  Silent tugging or silent sucking, without causing you pain.  Swallowing heard between every 3-4 sucks.  Muscle movement above and in front of his or her ears while sucking. Signs  that your baby has not successfully latched on to nipple:  Sucking sounds or smacking sounds from your baby while breastfeeding.  Nipple pain. If you think your baby has not latched on correctly, slip your finger into the corner of your baby's mouth to break the suction and place it between your baby's gums. Attempt breastfeeding initiation again. Signs of Successful Breastfeeding Signs from your baby:  A gradual decrease in the number of sucks or complete cessation of sucking.  Falling asleep.  Relaxation of his or her body.  Retention of a small amount of milk in his or her mouth.  Letting go of your breast by himself or herself. Signs from you:  Breasts that have increased in firmness, weight, and size 1-3 hours after feeding.  Breasts that are softer immediately after breastfeeding.  Increased milk volume, as well as a change in milk consistency and color by the fifth day of breastfeeding.  Nipples that are not sore, cracked, or bleeding. Signs That Your Baby is Getting Enough Milk  Wetting at least 3 diapers in a 24-hour period. The urine should be clear and pale yellow by age 5 days.  At least 3 stools in a 24-hour period by age 5 days. The stool should be soft and yellow.  At least 3 stools in a 24-hour period by age 7 days. The stool should be seedy and yellow.  No loss of weight greater than 10% of birth weight during the first 3 days of age.  Average weight gain of 4-7 ounces (113-198 g) per week after age 4 days.  Consistent daily weight gain by age 5 days, without weight loss after the age of 2 weeks. After a feeding, your baby may spit up a small amount. This is common. BREASTFEEDING FREQUENCY AND DURATION Frequent feeding will help you make more milk and can prevent sore nipples and breast engorgement. Breastfeed when you feel the need to reduce the fullness of your breasts or when your baby shows signs of hunger. This is called "breastfeeding on demand." Avoid  introducing a pacifier to your baby while you are working to establish breastfeeding (the first 4-6 weeks after your baby is born). After this time you may choose to use a pacifier. Research has shown that   pacifier use during the first year of a baby's life decreases the risk of sudden infant death syndrome (SIDS). Allow your baby to feed on each breast as long as he or she wants. Breastfeed until your baby is finished feeding. When your baby unlatches or falls asleep while feeding from the first breast, offer the second breast. Because newborns are often sleepy in the first few weeks of life, you may need to awaken your baby to get him or her to feed. Breastfeeding times will vary from baby to baby. However, the following rules can serve as a guide to help you ensure that your baby is properly fed:  Newborns (babies 4 weeks of age or younger) may breastfeed every 1-3 hours.  Newborns should not go longer than 3 hours during the day or 5 hours during the night without breastfeeding.  You should breastfeed your baby a minimum of 8 times in a 24-hour period until you begin to introduce solid foods to your baby at around 6 months of age. BREAST MILK PUMPING Pumping and storing breast milk allows you to ensure that your baby is exclusively fed your breast milk, even at times when you are unable to breastfeed. This is especially important if you are going back to work while you are still breastfeeding or when you are not able to be present during feedings. Your lactation consultant can give you guidelines on how long it is safe to store breast milk. A breast pump is a machine that allows you to pump milk from your breast into a sterile bottle. The pumped breast milk can then be stored in a refrigerator or freezer. Some breast pumps are operated by hand, while others use electricity. Ask your lactation consultant which type will work best for you. Breast pumps can be purchased, but some hospitals and  breastfeeding support groups lease breast pumps on a monthly basis. A lactation consultant can teach you how to hand express breast milk, if you prefer not to use a pump. CARING FOR YOUR BREASTS WHILE YOU BREASTFEED Nipples can become dry, cracked, and sore while breastfeeding. The following recommendations can help keep your breasts moisturized and healthy:  Avoid using soap on your nipples.  Wear a supportive bra. Although not required, special nursing bras and tank tops are designed to allow access to your breasts for breastfeeding without taking off your entire bra or top. Avoid wearing underwire-style bras or extremely tight bras.  Air dry your nipples for 3-4minutes after each feeding.  Use only cotton bra pads to absorb leaked breast milk. Leaking of breast milk between feedings is normal.  Use lanolin on your nipples after breastfeeding. Lanolin helps to maintain your skin's normal moisture barrier. If you use pure lanolin, you do not need to wash it off before feeding your baby again. Pure lanolin is not toxic to your baby. You may also hand express a few drops of breast milk and gently massage that milk into your nipples and allow the milk to air dry. In the first few weeks after giving birth, some women experience extremely full breasts (engorgement). Engorgement can make your breasts feel heavy, warm, and tender to the touch. Engorgement peaks within 3-5 days after you give birth. The following recommendations can help ease engorgement:  Completely empty your breasts while breastfeeding or pumping. You may want to start by applying warm, moist heat (in the shower or with warm water-soaked hand towels) just before feeding or pumping. This increases circulation and helps the milk   flow. If your baby does not completely empty your breasts while breastfeeding, pump any extra milk after he or she is finished.  Wear a snug bra (nursing or regular) or tank top for 1-2 days to signal your body  to slightly decrease milk production.  Apply ice packs to your breasts, unless this is too uncomfortable for you.  Make sure that your baby is latched on and positioned properly while breastfeeding. If engorgement persists after 48 hours of following these recommendations, contact your health care provider or a lactation consultant. OVERALL HEALTH CARE RECOMMENDATIONS WHILE BREASTFEEDING  Eat healthy foods. Alternate between meals and snacks, eating 3 of each per day. Because what you eat affects your breast milk, some of the foods may make your baby more irritable than usual. Avoid eating these foods if you are sure that they are negatively affecting your baby.  Drink milk, fruit juice, and water to satisfy your thirst (about 10 glasses a day).  Rest often, relax, and continue to take your prenatal vitamins to prevent fatigue, stress, and anemia.  Continue breast self-awareness checks.  Avoid chewing and smoking tobacco. Chemicals from cigarettes that pass into breast milk and exposure to secondhand smoke may harm your baby.  Avoid alcohol and drug use, including marijuana. Some medicines that may be harmful to your baby can pass through breast milk. It is important to ask your health care provider before taking any medicine, including all over-the-counter and prescription medicine as well as vitamin and herbal supplements. It is possible to become pregnant while breastfeeding. If birth control is desired, ask your health care provider about options that will be safe for your baby. SEEK MEDICAL CARE IF:  You feel like you want to stop breastfeeding or have become frustrated with breastfeeding.  You have painful breasts or nipples.  Your nipples are cracked or bleeding.  Your breasts are red, tender, or warm.  You have a swollen area on either breast.  You have a fever or chills.  You have nausea or vomiting.  You have drainage other than breast milk from your nipples.  Your  breasts do not become full before feedings by the fifth day after you give birth.  You feel sad and depressed.  Your baby is too sleepy to eat well.  Your baby is having trouble sleeping.   Your baby is wetting less than 3 diapers in a 24-hour period.  Your baby has less than 3 stools in a 24-hour period.  Your baby's skin or the white part of his or her eyes becomes yellow.   Your baby is not gaining weight by 5 days of age. SEEK IMMEDIATE MEDICAL CARE IF:  Your baby is overly tired (lethargic) and does not want to wake up and feed.  Your baby develops an unexplained fever.   This information is not intended to replace advice given to you by your health care provider. Make sure you discuss any questions you have with your health care provider.   Document Released: 08/20/2005 Document Revised: 05/11/2015 Document Reviewed: 02/11/2013 Elsevier Interactive Patient Education 2016 [employer]  

## 2016-05-24 NOTE — Progress Notes (Signed)
Patient stated that she has had irregular contractions not daily, no vaginal bleeding or pressure.

## 2016-05-24 NOTE — Progress Notes (Signed)
   PRENATAL VISIT NOTE  Subjective:  Shannon Mckee is a 30 y.o. G3P2002 at 5217w5d being seen today for ongoing prenatal care.  She is currently monitored for the following issues for this low-risk pregnancy and has Hashimoto's thyroiditis; PCO (polycystic ovaries); Thyroid nodule; Vitamin D deficiency; Supervision of high risk pregnancy, antepartum; Fibromyalgia; Cervical paraspinal muscle spasm; and Thyroid dysfunction in pregnancy, antepartum on her problem list.  Patient reports no complaints.  Contractions: Irregular. Vag. Bleeding: None.  Movement: Present. Denies leaking of fluid.   The following portions of the patient's history were reviewed and updated as appropriate: allergies, current medications, past family history, past medical history, past social history, past surgical history and problem list. Problem list updated.  Objective:   Vitals:   05/24/16 0932 05/24/16 1007  BP: (!) 143/83 127/81  Pulse: 93   Temp: 98.7 F (37.1 C)   Weight: 241 lb 6.4 oz (109.5 kg)     Fetal Status: Fetal Heart Rate (bpm): 145 Fundal Height: 23 cm Movement: Present     General:  Alert, oriented and cooperative. Patient is in no acute distress.  Skin: Skin is warm and dry. No rash noted.   Cardiovascular: Normal heart rate noted  Respiratory: Normal respiratory effort, no problems with respiration noted  Abdomen: Soft, gravid, appropriate for gestational age. Pain/Pressure: Absent     Pelvic:  Cervical exam deferred        Extremities: Normal range of motion.  Edema: Trace  Mental Status: Normal mood and affect. Normal behavior. Normal judgment and thought content.   Urinalysis: Urine Protein: Negative Urine Glucose: Negative  Assessment and Plan:  Pregnancy: G3P2002 at 2217w5d  1. Supervision of high risk pregnancy, antepartum, second trimester Continue routine prenatal care. 28 wk labs today - Tdap vaccine greater than or equal to 7yo IM  2. Thyroid dysfunction in pregnancy,  antepartum, second trimester Normal TSH at last visit  Preterm labor symptoms and general obstetric precautions including but not limited to vaginal bleeding, contractions, leaking of fluid and fetal movement were reviewed in detail with the patient. Please refer to After Visit Summary for other counseling recommendations.  No Follow-up on file.  Reva Boresanya S Ivy Puryear, MD

## 2016-05-25 LAB — CBC
Hematocrit: 34.8 % (ref 34.0–46.6)
Hemoglobin: 11.2 g/dL (ref 11.1–15.9)
MCH: 27.5 pg (ref 26.6–33.0)
MCHC: 32.2 g/dL (ref 31.5–35.7)
MCV: 86 fL (ref 79–97)
PLATELETS: 295 10*3/uL (ref 150–379)
RBC: 4.07 x10E6/uL (ref 3.77–5.28)
RDW: 14.1 % (ref 12.3–15.4)
WBC: 10.8 10*3/uL (ref 3.4–10.8)

## 2016-05-25 LAB — GLUCOSE TOLERANCE, 2 HOURS W/ 1HR
GLUCOSE, 2 HOUR: 139 mg/dL (ref 65–152)
Glucose, 1 hour: 166 mg/dL (ref 65–179)
Glucose, Fasting: 91 mg/dL (ref 65–91)

## 2016-05-25 LAB — RPR: RPR Ser Ql: NONREACTIVE

## 2016-05-25 LAB — HIV ANTIBODY (ROUTINE TESTING W REFLEX): HIV SCREEN 4TH GENERATION: NONREACTIVE

## 2016-06-21 ENCOUNTER — Ambulatory Visit (INDEPENDENT_AMBULATORY_CARE_PROVIDER_SITE_OTHER): Payer: Medicaid Other | Admitting: Obstetrics and Gynecology

## 2016-06-21 VITALS — BP 121/80 | HR 96 | Temp 98.2°F | Wt 241.6 lb

## 2016-06-21 DIAGNOSIS — O99280 Endocrine, nutritional and metabolic diseases complicating pregnancy, unspecified trimester: Secondary | ICD-10-CM

## 2016-06-21 DIAGNOSIS — O99283 Endocrine, nutritional and metabolic diseases complicating pregnancy, third trimester: Secondary | ICD-10-CM

## 2016-06-21 DIAGNOSIS — E079 Disorder of thyroid, unspecified: Secondary | ICD-10-CM

## 2016-06-21 DIAGNOSIS — O0990 Supervision of high risk pregnancy, unspecified, unspecified trimester: Secondary | ICD-10-CM

## 2016-06-21 NOTE — Progress Notes (Signed)
Patient is in office, states that she has irregular contractions but they are not consistent, reports good fetal movement.

## 2016-06-21 NOTE — Progress Notes (Signed)
   PRENATAL VISIT NOTE  Subjective:  Shannon DecampYolanda Mckee is a 30 y.o. G3P2002 at [redacted]w[redacted]d being seen today for ongoing prenatal care.  She is currently monitored for the following issues for this low-risk pregnancy and has Hashimoto's thyroiditis; PCO (polycystic ovaries); Thyroid nodule; Vitamin D deficiency; Supervision of high risk pregnancy, antepartum; Fibromyalgia; Cervical paraspinal muscle spasm; and Thyroid dysfunction in pregnancy, antepartum on her problem list.  Patient reports no complaints.  Contractions: Irregular. Vag. Bleeding: None.  Movement: Present. Denies leaking of fluid.   The following portions of the patient's history were reviewed and updated as appropriate: allergies, current medications, past family history, past medical history, past social history, past surgical history and problem list. Problem list updated.  Objective:   Vitals:   06/21/16 0952  BP: 121/80  Pulse: 96  Temp: 98.2 F (36.8 C)  Weight: 241 lb 9.6 oz (109.6 kg)    Fetal Status: Fetal Heart Rate (bpm): 140 Fundal Height: 30 cm Movement: Present     General:  Alert, oriented and cooperative. Patient is in no acute distress.  Skin: Skin is warm and dry. No rash noted.   Cardiovascular: Normal heart rate noted  Respiratory: Normal respiratory effort, no problems with respiration noted  Abdomen: Soft, gravid, appropriate for gestational age. Pain/Pressure: Absent     Pelvic:  Cervical exam deferred        Extremities: Normal range of motion.  Edema: Trace  Mental Status: Normal mood and affect. Normal behavior. Normal judgment and thought content.   Assessment and Plan:  Pregnancy: G3P2002 at [redacted]w[redacted]d  1. Supervision of high risk pregnancy, antepartum Patient is doing well without complaints  2. Thyroid dysfunction in pregnancy, antepartum Follow up with endocrinologist on 11/15 Patient is not currently on meds Normal labs in third trimester  Preterm labor symptoms and general obstetric  precautions including but not limited to vaginal bleeding, contractions, leaking of fluid and fetal movement were reviewed in detail with the patient. Please refer to After Visit Summary for other counseling recommendations.  Return in about 2 weeks (around 07/05/2016).  Catalina AntiguaPeggy Shantea Poulton, MD

## 2016-06-29 ENCOUNTER — Telehealth: Payer: Self-pay | Admitting: *Deleted

## 2016-06-29 NOTE — Telephone Encounter (Signed)
Patient called in office for test results, returned call to patient lab results reviewed, pt. Has no other concerns at this time.

## 2016-07-05 ENCOUNTER — Ambulatory Visit (INDEPENDENT_AMBULATORY_CARE_PROVIDER_SITE_OTHER): Payer: Medicaid Other | Admitting: Obstetrics and Gynecology

## 2016-07-05 VITALS — BP 126/74 | HR 95 | Wt 241.0 lb

## 2016-07-05 DIAGNOSIS — O99283 Endocrine, nutritional and metabolic diseases complicating pregnancy, third trimester: Secondary | ICD-10-CM

## 2016-07-05 DIAGNOSIS — E079 Disorder of thyroid, unspecified: Secondary | ICD-10-CM

## 2016-07-05 DIAGNOSIS — O0990 Supervision of high risk pregnancy, unspecified, unspecified trimester: Secondary | ICD-10-CM

## 2016-07-05 DIAGNOSIS — O99280 Endocrine, nutritional and metabolic diseases complicating pregnancy, unspecified trimester: Secondary | ICD-10-CM

## 2016-07-05 NOTE — Progress Notes (Signed)
   PRENATAL VISIT NOTE  Subjective:  Shannon DecampYolanda Mckee is a 30 y.o. G3P2002 at 4980w5d being seen today for ongoing prenatal care.  She is currently monitored for the following issues for this low-risk pregnancy and has Hashimoto's thyroiditis; PCO (polycystic ovaries); Thyroid nodule; Vitamin D deficiency; Supervision of high risk pregnancy, antepartum; Fibromyalgia; Cervical paraspinal muscle spasm; and Thyroid dysfunction in pregnancy, antepartum on her problem list.  Patient reports no complaints.  Contractions: Irregular. Vag. Bleeding: None.  Movement: Present. Denies leaking of fluid.   The following portions of the patient's history were reviewed and updated as appropriate: allergies, current medications, past family history, past medical history, past social history, past surgical history and problem list. Problem list updated.  Objective:   Vitals:   07/05/16 1037  BP: 126/74  Pulse: 95  Weight: 241 lb (109.3 kg)    Fetal Status: Fetal Heart Rate (bpm): 137 Fundal Height: 33 cm Movement: Present     General:  Alert, oriented and cooperative. Patient is in no acute distress.  Skin: Skin is warm and dry. No rash noted.   Cardiovascular: Normal heart rate noted  Respiratory: Normal respiratory effort, no problems with respiration noted  Abdomen: Soft, gravid, appropriate for gestational age. Pain/Pressure: Absent     Pelvic:  Cervical exam deferred        Extremities: Normal range of motion.     Mental Status: Normal mood and affect. Normal behavior. Normal judgment and thought content.   Assessment and Plan:  Pregnancy: G3P2002 at 6580w5d  1. Supervision of high risk pregnancy, antepartum Patient is doing well without complaints Follow up growth ultrasound ordered Patient denies any back/spine surgeries or injuries and declines anesthesia consult at this time Thyroid labs ordered today - TSH - T4, free - US MFM OB FOLLOW UP; Future  2. Thyroid dysfunction in pregnancy,  antepartum  - TSH - T4, free - US MFM OB FOLLOW UP; Future  Preterm labor symptoms and general obstetric precautions including but not limited to vaginal bleeding, contractions, leaking of fluid and fetal movement were reviewed in detail with the patient. Please refer to After Visit Summary for other counseling recommendations.  Return in about 2 weeks (around 07/19/2016).  Catalina AntiguaPeggy Deeann Servidio, MD

## 2016-07-05 NOTE — Addendum Note (Signed)
Addended by: Maretta BeesMCGLASHAN, Bradford Cazier J on: 07/05/2016 11:15 AM   Modules accepted: Orders

## 2016-07-06 LAB — TSH: TSH: 1.06 u[IU]/mL (ref 0.450–4.500)

## 2016-07-06 LAB — T4, FREE: Free T4: 1.05 ng/dL (ref 0.82–1.77)

## 2016-07-10 ENCOUNTER — Ambulatory Visit: Payer: Medicaid Other

## 2016-07-18 ENCOUNTER — Encounter: Payer: Self-pay | Admitting: Endocrinology

## 2016-07-18 ENCOUNTER — Ambulatory Visit (INDEPENDENT_AMBULATORY_CARE_PROVIDER_SITE_OTHER): Payer: Medicaid Other | Admitting: Endocrinology

## 2016-07-18 VITALS — BP 138/80 | HR 102 | Wt 242.0 lb

## 2016-07-18 DIAGNOSIS — O99280 Endocrine, nutritional and metabolic diseases complicating pregnancy, unspecified trimester: Secondary | ICD-10-CM

## 2016-07-18 DIAGNOSIS — E079 Disorder of thyroid, unspecified: Secondary | ICD-10-CM

## 2016-07-18 NOTE — Progress Notes (Signed)
   Subjective:    Patient ID: Shannon Mckee, female    DOB: Sep 26, 1985, [age] y.o.   MRN: [id number]  HPI Pt returns for f/u chronic primary hypothyroidism: She took synthroid only during a 2015-2016 pregnancy.  US showed tiny bilateral 0.4 cm hypoechoic thyroid nodules.  Pt also has PCO (she is G2P2 (2013 (had GDM) and 2016--she did not require induction of ovulation either time).  She is now [redacted] weeks pregnant.  pt states she feels well in general.    Past Medical History:  Diagnosis Date  . Anemia    Pregnancy related  . Anxiety   . Asthma   . Fibromyalgia   . History of PCOS   . Thyroid disease     Past Surgical History:  Procedure Laterality Date  . ANKLE SURGERY     x 2   . APPENDECTOMY    . CHOLECYSTECTOMY    . TONSILLECTOMY AND ADENOIDECTOMY      Social History   Social History  . Marital status: Married    Spouse name: N/A  . Number of children: N/A  . Years of education: N/A   Occupational History  . Not on file.   Social History Main Topics  . Smoking status: Never Smoker  . Smokeless tobacco: Never Used  . Alcohol use No  . Drug use: Unknown  . Sexual activity: Yes   Other Topics Concern  . Not on file   Social History Narrative  . No narrative on file    Current Outpatient Prescriptions on File Prior to Visit  Medication Sig Dispense Refill  . ondansetron (ZOFRAN) 4 MG tablet Take 1 tablet (4 mg total) by mouth daily as needed. (Patient not taking: Reported on 07/19/2016) 30 tablet 1  . Prenatal Vit-Fe Fumarate-FA (PRENATAL MULTIVITAMIN) TABS tablet Take 1 tablet by mouth daily at 12 noon.    . Vitamin D, Ergocalciferol, (DRISDOL) 50000 units CAPS capsule TK ONE C PO  WEEKLY  0   No current facility-administered medications on file prior to visit.     Allergies  Allergen Reactions  . Aspirin Palpitations  . Cefixime Anaphylaxis    Unknown reaction  . Latex Hives    Hive Palpitations  Hive Palpitations   . Sulfa Antibiotics Hives and Rash      Palpitations Palpitations    No family history on file.  BP 138/80   Pulse (!) 102   Wt 242 lb (109.8 kg)   LMP 11/09/2015 (LMP Unknown)   SpO2 98%   BMI 40.90 kg/m   Review of Systems She has gained no weight during this pregnancy.      Objective:   Physical Exam VITAL SIGNS:  See vs page GENERAL: no distress NECK: There is no palpable thyroid enlargement.  No thyroid nodule is palpable.  No palpable lymphadenopathy at the anterior neck.    Lab Results  Component Value Date   TSH 1.060 07/05/2016       Assessment & Plan:  Hypothyroidism: not active now, but she is at high risk for thyroid dysfunction in the postpartum period.    Patient is advised the following: Patient Instructions  No medication is needed for the thyroid now Please come back for a follow-up appointment in 3-4 months.  However, [facility] they can [facility] it instead, and you could come back here in 1 year.

## 2016-07-18 NOTE — Patient Instructions (Addendum)
No medication is needed for the thyroid now Please come back for a follow-up appointment in 3-4 months.  However, [facility], they can c heck it instead, and you could come back here in 1 year.

## 2016-07-19 ENCOUNTER — Ambulatory Visit (INDEPENDENT_AMBULATORY_CARE_PROVIDER_SITE_OTHER): Payer: Medicaid Other | Admitting: Obstetrics & Gynecology

## 2016-07-19 VITALS — BP 128/76 | HR 93 | Temp 98.9°F | Wt 243.6 lb

## 2016-07-19 DIAGNOSIS — O0990 Supervision of high risk pregnancy, unspecified, unspecified trimester: Secondary | ICD-10-CM

## 2016-07-19 DIAGNOSIS — O0993 Supervision of high risk pregnancy, unspecified, third trimester: Secondary | ICD-10-CM

## 2016-07-19 NOTE — Progress Notes (Signed)
   PRENATAL VISIT NOTE  Subjective:  Shannon DecampYolanda Mckee is a 30 y.o. G3P2002 at 5423w5d being seen today for ongoing prenatal care.  She is currently monitored for the following issues for this low-risk pregnancy and has Hashimoto's thyroiditis; PCO (polycystic ovaries); Thyroid nodule; Vitamin D deficiency; Supervision of high risk pregnancy, antepartum; Fibromyalgia; Cervical paraspinal muscle spasm; and Thyroid dysfunction in pregnancy, antepartum on her problem list.  Patient reports no complaints.  Contractions: Irregular. Vag. Bleeding: None.  Movement: Present. Denies leaking of fluid.   The following portions of the patient's history were reviewed and updated as appropriate: allergies, current medications, past family history, past medical history, past social history, past surgical history and problem list. Problem list updated.  Objective:   Vitals:   07/19/16 1431  BP: 128/76  Pulse: 93  Temp: 98.9 F (37.2 C)  Weight: 243 lb 9.6 oz (110.5 kg)    Fetal Status: Fetal Heart Rate (bpm): 140   Movement: Present     General:  Alert, oriented and cooperative. Patient is in no acute distress.  Skin: Skin is warm and dry. No rash noted.   Cardiovascular: Normal heart rate noted  Respiratory: Normal respiratory effort, no problems with respiration noted  Abdomen: Soft, gravid, appropriate for gestational age. Pain/Pressure: Present     Pelvic:  Cervical exam deferred        Extremities: Normal range of motion.  Edema: Trace  Mental Status: Normal mood and affect. Normal behavior. Normal judgment and thought content.   Assessment and Plan:  Pregnancy: G3P2002 at 323w5d  There are no diagnoses linked to this encounter. Preterm labor symptoms and general obstetric precautions including but not limited to vaginal bleeding, contractions, leaking of fluid and fetal movement were reviewed in detail with the patient. Please refer to After Visit Summary for other counseling recommendations.    Return in about 2 weeks (around 08/02/2016).   Adam PhenixJames G Neftali Abair, MD

## 2016-07-19 NOTE — Progress Notes (Signed)
Patient states that she has contractions that come and go, reports good fetal movement. 

## 2016-07-31 ENCOUNTER — Other Ambulatory Visit (INDEPENDENT_AMBULATORY_CARE_PROVIDER_SITE_OTHER): Payer: Medicaid Other

## 2016-07-31 ENCOUNTER — Other Ambulatory Visit: Payer: Self-pay | Admitting: *Deleted

## 2016-07-31 ENCOUNTER — Ambulatory Visit (INDEPENDENT_AMBULATORY_CARE_PROVIDER_SITE_OTHER): Payer: Medicaid Other | Admitting: Obstetrics & Gynecology

## 2016-07-31 VITALS — BP 120/80 | HR 103 | Temp 98.0°F | Wt 245.2 lb

## 2016-07-31 DIAGNOSIS — O99213 Obesity complicating pregnancy, third trimester: Secondary | ICD-10-CM

## 2016-07-31 DIAGNOSIS — O99210 Obesity complicating pregnancy, unspecified trimester: Secondary | ICD-10-CM | POA: Insufficient documentation

## 2016-07-31 DIAGNOSIS — Z3493 Encounter for supervision of normal pregnancy, unspecified, third trimester: Secondary | ICD-10-CM

## 2016-07-31 DIAGNOSIS — O0990 Supervision of high risk pregnancy, unspecified, unspecified trimester: Secondary | ICD-10-CM

## 2016-07-31 DIAGNOSIS — E669 Obesity, unspecified: Secondary | ICD-10-CM

## 2016-07-31 NOTE — Progress Notes (Signed)
Orders for u/s entered.

## 2016-07-31 NOTE — Progress Notes (Signed)
Patient states that she has sporadic contractions at times, no bleeding and reports good fetal movement.

## 2016-07-31 NOTE — Progress Notes (Signed)
   PRENATAL VISIT NOTE  Subjective:  Shannon Mckee is a 30 y.o. G3P2002 at 3044w3d being seen today for ongoing prenatal care.  She is currently monitored for the following issues for this low-risk pregnancy and has Hashimoto's thyroiditis; PCO (polycystic ovaries); Thyroid nodule; Vitamin D deficiency; Supervision of high risk pregnancy, antepartum; Fibromyalgia; Cervical paraspinal muscle spasm; and Thyroid dysfunction in pregnancy, antepartum on her problem list.  Patient reports no complaints.  Contractions: Irregular. Vag. Bleeding: None.  Movement: Present. Denies leaking of fluid.   The following portions of the patient's history were reviewed and updated as appropriate: allergies, current medications, past family history, past medical history, past social history, past surgical history and problem list. Problem list updated.  Objective:   Vitals:   07/31/16 1358  BP: 120/80  Pulse: (!) 103  Temp: 98 F (36.7 C)  Weight: 245 lb 3.2 oz (111.2 kg)    Fetal Status: Fetal Heart Rate (bpm): 152   Movement: Present     General:  Alert, oriented and cooperative. Patient is in no acute distress.  Skin: Skin is warm and dry. No rash noted.   Cardiovascular: Normal heart rate noted  Respiratory: Normal respiratory effort, no problems with respiration noted  Abdomen: Soft, gravid, appropriate for gestational age. Pain/Pressure: Present     Pelvic:  Cervical exam performed        Extremities: Normal range of motion.  Edema: Trace  Mental Status: Normal mood and affect. Normal behavior. Normal judgment and thought content.   Assessment and Plan:  Pregnancy: G3P2002 at 7344w3d  1. Supervision of high risk pregnancy, antepartum Will stop fetal testing as she is euthyroid on no medication and I don't recommend testing for obesity only - US FETAL BPP WO NON STRESS; Future  Term labor symptoms and general obstetric precautions including but not limited to vaginal bleeding, contractions,  leaking of fluid and fetal movement were reviewed in detail with the patient. Please refer to After Visit Summary for other counseling recommendations.  1 week f/u  Adam PhenixJames G Arnold, MD

## 2016-08-02 LAB — STREP GP B NAA: STREP GROUP B AG: NEGATIVE

## 2016-08-07 ENCOUNTER — Other Ambulatory Visit: Payer: Medicaid Other

## 2016-08-07 ENCOUNTER — Ambulatory Visit (INDEPENDENT_AMBULATORY_CARE_PROVIDER_SITE_OTHER): Payer: Medicaid Other | Admitting: Obstetrics & Gynecology

## 2016-08-07 VITALS — BP 128/84 | HR 92 | Wt 243.6 lb

## 2016-08-07 DIAGNOSIS — O0990 Supervision of high risk pregnancy, unspecified, unspecified trimester: Secondary | ICD-10-CM

## 2016-08-07 DIAGNOSIS — E079 Disorder of thyroid, unspecified: Secondary | ICD-10-CM

## 2016-08-07 DIAGNOSIS — O99283 Endocrine, nutritional and metabolic diseases complicating pregnancy, third trimester: Secondary | ICD-10-CM

## 2016-08-07 DIAGNOSIS — O99280 Endocrine, nutritional and metabolic diseases complicating pregnancy, unspecified trimester: Secondary | ICD-10-CM

## 2016-08-07 NOTE — Progress Notes (Signed)
Patient reports she is doing well 

## 2016-08-07 NOTE — Progress Notes (Signed)
   PRENATAL VISIT NOTE  Subjective:  Shannon Mckee is a 30 y.o. G3P2002 at 6051w3d being seen today for ongoing prenatal care.  She is currently monitored for the following issues for this high-risk pregnancy and has Hashimoto's thyroiditis; PCO (polycystic ovaries); Thyroid nodule; Vitamin D deficiency; Supervision of high risk pregnancy, antepartum; Fibromyalgia; Cervical paraspinal muscle spasm; Thyroid dysfunction in pregnancy, antepartum; and Maternal obesity, antepartum on her problem list.  Patient reports no complaints.  Contractions: Irregular. Vag. Bleeding: None.  Movement: Present. Denies leaking of fluid.   The following portions of the patient's history were reviewed and updated as appropriate: allergies, current medications, past family history, past medical history, past social history, past surgical history and problem list. Problem list updated.  Objective:   Vitals:   08/07/16 1059  BP: 128/84  Pulse: 92  Weight: 243 lb 9.6 oz (110.5 kg)    Fetal Status: Fetal Heart Rate (bpm): 144   Movement: Present     General:  Alert, oriented and cooperative. Patient is in no acute distress.  Skin: Skin is warm and dry. No rash noted.   Cardiovascular: Normal heart rate noted  Respiratory: Normal respiratory effort, no problems with respiration noted  Abdomen: Soft, gravid, appropriate for gestational age. Pain/Pressure: Present     Pelvic:  Cervical exam deferred        Extremities: Normal range of motion.  Edema: None  Mental Status: Normal mood and affect. Normal behavior. Normal judgment and thought content.  Doing well Assessment and Plan:  Pregnancy: G3P2002 at 3551w3d  1. Supervision of high risk pregnancy, antepartum Nl BP  2. Thyroid dysfunction in pregnancy, antepartum Doing well  Term labor symptoms and general obstetric precautions including but not limited to vaginal bleeding, contractions, leaking of fluid and fetal movement were reviewed in detail with the  patient. Please refer to After Visit Summary for other counseling recommendations.  Return in about 1 week (around 08/14/2016).   Adam PhenixJames G Annagrace Carr, MD

## 2016-08-14 ENCOUNTER — Ambulatory Visit (INDEPENDENT_AMBULATORY_CARE_PROVIDER_SITE_OTHER): Payer: Medicaid Other | Admitting: Obstetrics

## 2016-08-14 ENCOUNTER — Encounter: Payer: Self-pay | Admitting: Obstetrics

## 2016-08-14 DIAGNOSIS — Z3483 Encounter for supervision of other normal pregnancy, third trimester: Secondary | ICD-10-CM

## 2016-08-14 DIAGNOSIS — O0990 Supervision of high risk pregnancy, unspecified, unspecified trimester: Secondary | ICD-10-CM

## 2016-08-14 NOTE — Progress Notes (Signed)
Subjective:    Shannon DecampYolanda Mckee is a 30 y.o. female being seen today for her obstetrical visit. She is at 5742w3d gestation. Patient reports no complaints. Fetal movement: normal.  Problem List Items Addressed This Visit    None     Patient Active Problem List   Diagnosis Date Noted  . Maternal obesity, antepartum 07/31/2016  . Thyroid dysfunction in pregnancy, antepartum 05/24/2016  . Supervision of high risk pregnancy, antepartum 01/19/2016  . Thyroid nodule 01/16/2016  . Vitamin D deficiency 01/16/2016  . Hashimoto's thyroiditis 09/23/2015  . PCO (polycystic ovaries) 09/23/2015  . Fibromyalgia 09/19/2015  . Cervical paraspinal muscle spasm 09/19/2015    Objective:    BP 131/86   Pulse 96   Temp 98.4 F (36.9 C)   Wt 245 lb 6.4 oz (111.3 kg)   LMP 11/09/2015 (LMP Unknown)   BMI 41.47 kg/m  FHT: 140 BPM    Assessment:    Pregnancy @ 742w3d weeks   Plan:   Plans for delivery: Vaginal anticipated; labs reviewed; problem list updated Counseling: Consent signed. Infant feeding: plans to breastfeed. Cigarette smoking: never smoked. L&D discussion: symptoms of labor, discussed when to call, discussed what number to call, anesthetic/analgesic options reviewed and delivering clinician:  plans no preference. Postpartum supports and preparation: circumcision discussed and contraception plans discussed.  Follow up in 1 Week.

## 2016-08-21 ENCOUNTER — Ambulatory Visit (INDEPENDENT_AMBULATORY_CARE_PROVIDER_SITE_OTHER): Payer: Medicaid Other | Admitting: Certified Nurse Midwife

## 2016-08-21 ENCOUNTER — Encounter: Payer: Medicaid Other | Admitting: Certified Nurse Midwife

## 2016-08-21 VITALS — BP 128/83 | HR 89 | Temp 98.6°F | Wt 244.1 lb

## 2016-08-21 DIAGNOSIS — E063 Autoimmune thyroiditis: Secondary | ICD-10-CM

## 2016-08-21 DIAGNOSIS — E669 Obesity, unspecified: Secondary | ICD-10-CM

## 2016-08-21 DIAGNOSIS — O99283 Endocrine, nutritional and metabolic diseases complicating pregnancy, third trimester: Secondary | ICD-10-CM | POA: Diagnosis not present

## 2016-08-21 DIAGNOSIS — O99280 Endocrine, nutritional and metabolic diseases complicating pregnancy, unspecified trimester: Secondary | ICD-10-CM

## 2016-08-21 DIAGNOSIS — O99213 Obesity complicating pregnancy, third trimester: Secondary | ICD-10-CM

## 2016-08-21 DIAGNOSIS — E079 Disorder of thyroid, unspecified: Secondary | ICD-10-CM

## 2016-08-21 DIAGNOSIS — E282 Polycystic ovarian syndrome: Secondary | ICD-10-CM

## 2016-08-21 DIAGNOSIS — O0990 Supervision of high risk pregnancy, unspecified, unspecified trimester: Secondary | ICD-10-CM

## 2016-08-21 DIAGNOSIS — M797 Fibromyalgia: Secondary | ICD-10-CM

## 2016-08-21 DIAGNOSIS — O99210 Obesity complicating pregnancy, unspecified trimester: Secondary | ICD-10-CM

## 2016-08-21 NOTE — Progress Notes (Signed)
Subjective:    Shannon Mckee is a 30 y.o. female being seen today for her obstetrical visit. She is at 4962w3d gestation. Patient reports no complaints. Fetal movement: normal.  Problem List Items Addressed This Visit      Endocrine   Hashimoto's thyroiditis   Relevant Orders   US MFM OB FOLLOW UP   PCO (polycystic ovaries)   Thyroid dysfunction in pregnancy, antepartum   Relevant Orders   US MFM OB FOLLOW UP     Other   Supervision of high risk pregnancy, antepartum - Primary   Relevant Orders   US MFM OB FOLLOW UP   Fibromyalgia   Maternal obesity, antepartum   Relevant Orders   US MFM OB FOLLOW UP     Patient Active Problem List   Diagnosis Date Noted  . Maternal obesity, antepartum 07/31/2016  . Thyroid dysfunction in pregnancy, antepartum 05/24/2016  . Supervision of high risk pregnancy, antepartum 01/19/2016  . Thyroid nodule 01/16/2016  . Vitamin D deficiency 01/16/2016  . Hashimoto's thyroiditis 09/23/2015  . PCO (polycystic ovaries) 09/23/2015  . Fibromyalgia 09/19/2015  . Cervical paraspinal muscle spasm 09/19/2015    Objective:    BP 128/83   Pulse 89   Temp 98.6 F (37 C)   Wt 244 lb 1.6 oz (110.7 kg)   LMP 11/09/2015 (LMP Unknown)   BMI 41.25 kg/m  FHT: 150 BPM  Uterine Size: 40 cm and size equals dates  Presentations: cephalic  Pelvic Exam:              Dilation: 1.5       Effacement: Long             Station:  -3    Consistency: medium            Position: anterior    NST: + accels, no decels, moderate variability, Cat. 1 tracing. No contractions on toco.    Assessment:    Pregnancy @ 1062w3d weeks   Reactive NST  Plan:    F/U US @ MFM ordered, had been ordered previously by Dr. Ladean Rayaonstance.     Discussed MFM recommendations for timed delivery @39  weeks; Dr. Rande LawmanErwin states no indication for delivery based on thyroid or morbid obesity.   Plans for delivery: Vaginal anticipated; labs reviewed; problem list updated Counseling: Consent  signed. Infant feeding: plans to breastfeed, plans to bottle feed. Cigarette smoking: never smoked. L&D discussion: symptoms of labor, discussed when to call, discussed what number to call, anesthetic/analgesic options reviewed and delivering clinician:  plans no preference. Postpartum supports and preparation: circumcision discussed and contraception plans discussed.  Follow up in 1 Week.

## 2016-08-22 ENCOUNTER — Encounter (HOSPITAL_COMMUNITY): Payer: Self-pay

## 2016-08-22 ENCOUNTER — Other Ambulatory Visit: Payer: Self-pay | Admitting: Certified Nurse Midwife

## 2016-08-22 ENCOUNTER — Ambulatory Visit (HOSPITAL_COMMUNITY)
Admission: RE | Admit: 2016-08-22 | Discharge: 2016-08-22 | Disposition: A | Discharge status: Home / Self Care | Payer: Medicaid Other | Source: Ambulatory Visit | Attending: Certified Nurse Midwife | Admitting: Certified Nurse Midwife

## 2016-08-22 DIAGNOSIS — Z3A29 29 weeks gestation of pregnancy: Secondary | ICD-10-CM | POA: Diagnosis not present

## 2016-08-22 DIAGNOSIS — Z3A39 39 weeks gestation of pregnancy: Secondary | ICD-10-CM

## 2016-08-22 DIAGNOSIS — O99280 Endocrine, nutritional and metabolic diseases complicating pregnancy, unspecified trimester: Secondary | ICD-10-CM

## 2016-08-22 DIAGNOSIS — O99213 Obesity complicating pregnancy, third trimester: Secondary | ICD-10-CM | POA: Insufficient documentation

## 2016-08-22 DIAGNOSIS — O99283 Endocrine, nutritional and metabolic diseases complicating pregnancy, third trimester: Secondary | ICD-10-CM | POA: Diagnosis not present

## 2016-08-22 DIAGNOSIS — O99210 Obesity complicating pregnancy, unspecified trimester: Secondary | ICD-10-CM

## 2016-08-22 DIAGNOSIS — O0990 Supervision of high risk pregnancy, unspecified, unspecified trimester: Secondary | ICD-10-CM

## 2016-08-22 DIAGNOSIS — E063 Autoimmune thyroiditis: Secondary | ICD-10-CM

## 2016-08-22 DIAGNOSIS — Z362 Encounter for other antenatal screening follow-up: Secondary | ICD-10-CM | POA: Diagnosis not present

## 2016-08-22 DIAGNOSIS — E079 Disorder of thyroid, unspecified: Secondary | ICD-10-CM

## 2016-08-24 ENCOUNTER — Other Ambulatory Visit: Payer: Self-pay | Admitting: Certified Nurse Midwife

## 2016-08-24 DIAGNOSIS — O3663X9 Maternal care for excessive fetal growth, third trimester, other fetus: Secondary | ICD-10-CM

## 2016-08-27 ENCOUNTER — Inpatient Hospital Stay (HOSPITAL_COMMUNITY): Payer: Medicaid Other | Admitting: Anesthesiology

## 2016-08-27 ENCOUNTER — Encounter (HOSPITAL_COMMUNITY): Payer: Self-pay

## 2016-08-27 ENCOUNTER — Inpatient Hospital Stay (HOSPITAL_COMMUNITY)
Admission: AD | Admit: 2016-08-27 | Discharge: 2016-08-29 | DRG: 775 | Disposition: A | Discharge status: Home / Self Care | Payer: Medicaid Other | Source: Ambulatory Visit | Attending: Obstetrics & Gynecology | Admitting: Obstetrics & Gynecology

## 2016-08-27 DIAGNOSIS — Z6841 Body Mass Index (BMI) 40.0 and over, adult: Secondary | ICD-10-CM

## 2016-08-27 DIAGNOSIS — O9081 Anemia of the puerperium: Secondary | ICD-10-CM | POA: Diagnosis not present

## 2016-08-27 DIAGNOSIS — O134 Gestational [pregnancy-induced] hypertension without significant proteinuria, complicating childbirth: Secondary | ICD-10-CM | POA: Diagnosis not present

## 2016-08-27 DIAGNOSIS — O99214 Obesity complicating childbirth: Secondary | ICD-10-CM | POA: Diagnosis present

## 2016-08-27 DIAGNOSIS — D649 Anemia, unspecified: Secondary | ICD-10-CM | POA: Diagnosis not present

## 2016-08-27 DIAGNOSIS — O3663X0 Maternal care for excessive fetal growth, third trimester, not applicable or unspecified: Secondary | ICD-10-CM | POA: Diagnosis present

## 2016-08-27 DIAGNOSIS — Z3A40 40 weeks gestation of pregnancy: Secondary | ICD-10-CM | POA: Diagnosis not present

## 2016-08-27 DIAGNOSIS — O139 Gestational [pregnancy-induced] hypertension without significant proteinuria, unspecified trimester: Secondary | ICD-10-CM

## 2016-08-27 LAB — COMPREHENSIVE METABOLIC PANEL
ALT: 8 U/L — AB (ref 14–54)
AST: 19 U/L (ref 15–41)
Albumin: 2.8 g/dL — ABNORMAL LOW (ref 3.5–5.0)
Alkaline Phosphatase: 134 U/L — ABNORMAL HIGH (ref 38–126)
Anion gap: 11 (ref 5–15)
BUN: 9 mg/dL (ref 6–20)
CHLORIDE: 104 mmol/L (ref 101–111)
CO2: 19 mmol/L — AB (ref 22–32)
CREATININE: 0.6 mg/dL (ref 0.44–1.00)
Calcium: 8.5 mg/dL — ABNORMAL LOW (ref 8.9–10.3)
GFR calc Af Amer: >60 mL/min (ref >60)
GFR calc non Af Amer: >60 mL/min (ref >60)
Glucose, Bld: 101 mg/dL — ABNORMAL HIGH (ref 65–99)
Potassium: 3.7 mmol/L (ref 3.5–5.1)
SODIUM: 134 mmol/L — AB (ref 135–145)
Total Bilirubin: 0.1 mg/dL — ABNORMAL LOW (ref 0.3–1.2)
Total Protein: 6.7 g/dL (ref 6.5–8.1)

## 2016-08-27 LAB — CBC
HEMATOCRIT: 32.9 % — AB (ref 36.0–46.0)
HEMATOCRIT: 31.7 % — AB (ref 36.0–46.0)
Hemoglobin: 10.6 g/dL — ABNORMAL LOW (ref 12.0–15.0)
Hemoglobin: 10.2 g/dL — ABNORMAL LOW (ref 12.0–15.0)
MCH: 25.5 pg — ABNORMAL LOW (ref 26.0–34.0)
MCH: 25.4 pg — AB (ref 26.0–34.0)
MCHC: 32.2 g/dL (ref 30.0–36.0)
MCHC: 32.2 g/dL (ref 30.0–36.0)
MCV: 79.3 fL (ref 78.0–100.0)
MCV: 79.1 fL (ref 78.0–100.0)
PLATELETS: 285 10*3/uL (ref 150–400)
Platelets: 262 10*3/uL (ref 150–400)
RBC: 4.15 MIL/uL (ref 3.87–5.11)
RBC: 4.01 MIL/uL (ref 3.87–5.11)
RDW: 15.5 % (ref 11.5–15.5)
RDW: 15.6 % — AB (ref 11.5–15.5)
WBC: 12.2 10*3/uL — AB (ref 4.0–10.5)
WBC: 12 10*3/uL — ABNORMAL HIGH (ref 4.0–10.5)

## 2016-08-27 LAB — TYPE AND SCREEN
ABO/RH(D): O POS
ANTIBODY SCREEN: NEGATIVE

## 2016-08-27 LAB — PROTEIN / CREATININE RATIO, URINE
Creatinine, Urine: 84 mg/dL
Total Protein, Urine: <6 mg/dL

## 2016-08-27 LAB — ABO/RH: ABO/RH(D): O POS

## 2016-08-27 LAB — RPR: RPR: NONREACTIVE

## 2016-08-27 MED ORDER — DIBUCAINE 1 % RE OINT: 1.0000 "application " | TOPICAL_OINTMENT | RECTAL | Status: DC | PRN

## 2016-08-27 MED ORDER — ACETAMINOPHEN 325 MG PO TABS
650.0000 mg | ORAL_TABLET | ORAL | Status: DC | PRN
  Administered 2016-08-27 – 2016-08-29 (×5): 650 mg via ORAL
  Filled 2016-08-27 (×5): qty 2

## 2016-08-27 MED ORDER — HYDROMORPHONE HCL 2 MG PO TABS
2.0000 mg | ORAL_TABLET | ORAL | Status: DC | PRN
  Administered 2016-08-27 – 2016-08-29 (×5): 2 mg via ORAL
  Filled 2016-08-27 (×5): qty 1

## 2016-08-27 MED ORDER — FENTANYL CITRATE (PF) 100 MCG/2ML IJ SOLN
50.0000 ug | INTRAMUSCULAR | Status: DC | PRN
  Administered 2016-08-27: 100 ug via INTRAVENOUS
  Filled 2016-08-27: qty 2

## 2016-08-27 MED ORDER — PRENATAL MULTIVITAMIN CH
1.0000 | ORAL_TABLET | Freq: Every day | ORAL | Status: DC
  Administered 2016-08-28 – 2016-08-29 (×2): 1 via ORAL
  Filled 2016-08-27 (×2): qty 1

## 2016-08-27 MED ORDER — COCONUT OIL OIL: 1.0000 "application " | TOPICAL_OIL | Status: DC | PRN

## 2016-08-27 MED ORDER — ZOLPIDEM TARTRATE 5 MG PO TABS: 5.0000 mg | ORAL_TABLET | Freq: Every evening | ORAL | Status: DC | PRN

## 2016-08-27 MED ORDER — OXYTOCIN 40 UNITS IN LACTATED RINGERS INFUSION - SIMPLE MED
2.5000 [IU]/h | INTRAVENOUS | Status: DC
  Filled 2016-08-27: qty 1000

## 2016-08-27 MED ORDER — OXYCODONE-ACETAMINOPHEN 5-325 MG PO TABS: 2.0000 | ORAL_TABLET | ORAL | Status: DC | PRN

## 2016-08-27 MED ORDER — TETANUS-DIPHTH-ACELL PERTUSSIS 5-2.5-18.5 LF-MCG/0.5 IM SUSP: 0.5000 mL | Freq: Once | INTRAMUSCULAR | Status: DC

## 2016-08-27 MED ORDER — LACTATED RINGERS IV SOLN
500.0000 mL | Freq: Once | INTRAVENOUS | Status: AC
  Administered 2016-08-27: 500 mL via INTRAVENOUS

## 2016-08-27 MED ORDER — EPHEDRINE 5 MG/ML INJ
10.0000 mg | INTRAVENOUS | Status: DC | PRN
  Filled 2016-08-27: qty 4

## 2016-08-27 MED ORDER — OXYCODONE-ACETAMINOPHEN 5-325 MG PO TABS: 1.0000 | ORAL_TABLET | ORAL | Status: DC | PRN

## 2016-08-27 MED ORDER — LIDOCAINE HCL (PF) 1 % IJ SOLN
INTRAMUSCULAR | Status: DC | PRN
Start: 2016-08-27 — End: 2016-08-27
  Administered 2016-08-27 (×2): 4 mL via EPIDURAL

## 2016-08-27 MED ORDER — PHENYLEPHRINE 40 MCG/ML (10ML) SYRINGE FOR IV PUSH (FOR BLOOD PRESSURE SUPPORT)
80.0000 ug | PREFILLED_SYRINGE | INTRAVENOUS | Status: DC | PRN
  Filled 2016-08-27: qty 5
  Filled 2016-08-27: qty 10

## 2016-08-27 MED ORDER — DIPHENHYDRAMINE HCL 25 MG PO CAPS: 25.0000 mg | ORAL_CAPSULE | Freq: Four times a day (QID) | ORAL | Status: DC | PRN

## 2016-08-27 MED ORDER — SIMETHICONE 80 MG PO CHEW: 80.0000 mg | CHEWABLE_TABLET | ORAL | Status: DC | PRN

## 2016-08-27 MED ORDER — ACETAMINOPHEN 325 MG PO TABS: 650.0000 mg | ORAL_TABLET | ORAL | Status: DC | PRN

## 2016-08-27 MED ORDER — FLEET ENEMA 7-19 GM/118ML RE ENEM: 1.0000 | ENEMA | RECTAL | Status: DC | PRN

## 2016-08-27 MED ORDER — SENNOSIDES-DOCUSATE SODIUM 8.6-50 MG PO TABS
2.0000 | ORAL_TABLET | ORAL | Status: DC
  Administered 2016-08-27: 2 via ORAL
  Filled 2016-08-27: qty 2

## 2016-08-27 MED ORDER — BENZOCAINE-MENTHOL 20-0.5 % EX AERO
1.0000 "application " | INHALATION_SPRAY | CUTANEOUS | Status: DC | PRN
  Administered 2016-08-27: 1 via TOPICAL
  Filled 2016-08-27: qty 56

## 2016-08-27 MED ORDER — LACTATED RINGERS IV SOLN
500.0000 mL | INTRAVENOUS | Status: DC | PRN
  Administered 2016-08-27: 500 mL via INTRAVENOUS

## 2016-08-27 MED ORDER — OXYTOCIN BOLUS FROM INFUSION
500.0000 mL | Freq: Once | INTRAVENOUS | Status: AC
  Administered 2016-08-27: 1 mL via INTRAVENOUS

## 2016-08-27 MED ORDER — FENTANYL 2.5 MCG/ML BUPIVACAINE 1/10 % EPIDURAL INFUSION (WH - ANES)
14.0000 mL/h | INTRAMUSCULAR | Status: DC | PRN
  Administered 2016-08-27 (×2): 14 mL/h via EPIDURAL
  Filled 2016-08-27: qty 100

## 2016-08-27 MED ORDER — ONDANSETRON HCL 4 MG PO TABS: 4.0000 mg | ORAL_TABLET | ORAL | Status: DC | PRN

## 2016-08-27 MED ORDER — MISOPROSTOL 25 MCG QUARTER TABLET
25.0000 ug | ORAL_TABLET | ORAL | Status: DC | PRN
  Administered 2016-08-27: 25 ug via VAGINAL
  Filled 2016-08-27: qty 0.25
  Filled 2016-08-27: qty 1

## 2016-08-27 MED ORDER — WITCH HAZEL-GLYCERIN EX PADS
1.0000 "application " | MEDICATED_PAD | CUTANEOUS | Status: DC | PRN
  Administered 2016-08-27: 1 via TOPICAL

## 2016-08-27 MED ORDER — LIDOCAINE HCL (PF) 1 % IJ SOLN
30.0000 mL | INTRAMUSCULAR | Status: DC | PRN
  Filled 2016-08-27: qty 30

## 2016-08-27 MED ORDER — ONDANSETRON HCL 4 MG/2ML IJ SOLN: 4.0000 mg | INTRAMUSCULAR | Status: DC | PRN

## 2016-08-27 MED ORDER — TERBUTALINE SULFATE 1 MG/ML IJ SOLN
0.2500 mg | Freq: Once | INTRAMUSCULAR | Status: DC | PRN
  Filled 2016-08-27: qty 1

## 2016-08-27 MED ORDER — ONDANSETRON HCL 4 MG/2ML IJ SOLN: 4.0000 mg | Freq: Four times a day (QID) | INTRAMUSCULAR | Status: DC | PRN

## 2016-08-27 MED ORDER — SOD CITRATE-CITRIC ACID 500-334 MG/5ML PO SOLN: 30.0000 mL | ORAL | Status: DC | PRN

## 2016-08-27 MED ORDER — LACTATED RINGERS IV SOLN
INTRAVENOUS | Status: DC
  Administered 2016-08-27 (×2): via INTRAVENOUS

## 2016-08-27 MED ORDER — DIPHENHYDRAMINE HCL 50 MG/ML IJ SOLN: 12.5000 mg | INTRAMUSCULAR | Status: DC | PRN

## 2016-08-27 NOTE — Anesthesia Pain Management Evaluation Note (Signed)
  [facility] Pain Management Visit Note  Patient: Shannon DecampYolanda Mckee, 30 y.o., female  "Hello [facility]. We have an anesthesia team available at all times to provide care throughout the hospital, including epidural management and anesthesia for C-section. I don't know your plan for the delivery whether it a natural birth, water birth, IV sedation, nitrous supplementation, doula or epidural, but we want to meet your pain goals."   1.Was your pain managed to your expectations on prior hospitalizations?   Yes   2.What is your expectation for pain management during this hospitalization?     Epidural  3.How can we help you reach that goal? unsure  Record the patient's initial score and the patient's pain goal.   Pain: 8  Pain Goal: 4 [facility] wants you to be able to say your pain was always managed very well.  Cephus ShellingBURGER,Elise Knobloch 08/27/2016

## 2016-08-27 NOTE — H&P (Signed)
LABOR AND DELIVERY ADMISSION HISTORY AND PHYSICAL NOTE  Shannon Mckee is a 30 y.o. female 353P2002 with IUP at 920w2d [facility] Patient presented to [facility] with contractions, and found to have elevated pressures. Pre-E workup negative.    She reports positive fetal movement. She denies leakage of fluid or vaginal bleeding.  Prenatal History/Complications: Clinic  [facility] Prenatal Labs  Dating  US @11wks  Blood type: O/Positive/-- (05/18 1520)   Genetic Screen 1 Screen:    AFP:     Quad:     NIPS: Mat21: negative Antibody:Negative (05/18 1520)  Anatomic US  Normal @ 18wks Rubella: 8.30 (05/18 1520)  GTT  Third trimester: Nml RPR: Non Reactive (09/21 1125)   Flu vaccine declined HBsAg: Negative (05/18 1520)   TDaP vaccine     05/24/16                                           HIV: Non Reactive (09/21 1125)   Baby Food          Breast, hx of no milk pd. Because of hyperthyroidism                                     ZOX:WRUEAVWUGBS:Negative (11/28 1627)(For PCN allergy, check sensitivities)  Contraception  ?IUD Pap: 12/29/15: normal  Circumcision  [facility]   Pediatrician  [facility] [place]   Support Person  spouse    Endo is following patient.     Past Medical History: Past Medical History:  Diagnosis Date  . Anemia    Pregnancy related  . Anxiety   . Asthma   . Fibromyalgia   . History of PCOS   . Thyroid disease     Past Surgical History: Past Surgical History:  Procedure Laterality Date  . ANKLE SURGERY     x 2   . APPENDECTOMY    . CHOLECYSTECTOMY    . TONSILLECTOMY AND ADENOIDECTOMY      Obstetrical History: OB History    Gravida Para Term Preterm AB Living   3 2 2     2    SAB TAB Ectopic Multiple Live Births           2      Social History: Social History   Social History  . Marital status: Married    Spouse name: N/A  . Number of children: N/A  . Years of education: N/A   Social History Main Topics  . Smoking status: Never Smoker  .  Smokeless tobacco: Never Used  . Alcohol use No  . Drug use: Unknown  . Sexual activity: Yes   Other Topics Concern  . None   Social History Narrative  . None    Family History: History reviewed. No pertinent family history.  Allergies: Allergies  Allergen Reactions  . Aspirin Palpitations  . Cefixime Anaphylaxis    Unknown reaction  . Latex Hives    Hive Palpitations  Hive Palpitations   . Sulfa Antibiotics Hives and Rash    Palpitations Palpitations    Prescriptions Prior to Admission  Medication Sig Dispense Refill Last Dose  . ondansetron (ZOFRAN) 4 MG tablet Take 1 tablet (4 mg total) by mouth daily as needed. (Patient not taking: Reported on 08/22/2016) 30 tablet 1 Not Taking  .  Prenatal Vit-Fe Fumarate-FA (PRENATAL MULTIVITAMIN) TABS tablet Take 1 tablet by mouth daily at 12 noon.   Taking  . Vitamin D, Ergocalciferol, (DRISDOL) 50000 units CAPS capsule TK ONE C PO  WEEKLY  0 Taking     Review of Systems   All systems reviewed and negative except as stated in HPI  Blood pressure 153/90, pulse 91, temperature 98.2 F (36.8 C), temperature source Oral, resp. rate 18, last menstrual period 11/09/2015. General appearance: alert, cooperative and no distress Lungs: clear to auscultation bilaterally Heart: regular rate and rhythm Abdomen: soft, non-tender; bowel sounds normal Extremities: No calf swelling or tenderness Presentation: cephalic Fetal monitoring: 130. Mod var. No decels Uterine activity: q2-3 Dilation: 2.5 Effacement (%): Thick Station: -3 Exam by:: Lanice ShirtsV Rogers RN    Prenatal labs: ABO, Rh: O/Positive/-- (05/18 1520) Antibody: Negative (05/18 1520) Rubella: !Error! RPR: Non Reactive (09/21 1125)  HBsAg: Negative (05/18 1520)  HIV: Non Reactive (09/21 1125)  GBS: Negative (11/28 1627)  1 hr Glucola: 166 Genetic screening:  nml Anatomy US: nml  Prenatal Transfer Tool  Maternal Diabetes: No Genetic Screening: Normal Maternal  Ultrasounds/Referrals: Normal Fetal Ultrasounds or other Referrals:  None Maternal Substance Abuse:  No Significant Maternal Medications:  None Significant Maternal Lab Results: Lab values include: Group B Strep negative  Results for orders placed or performed during the hospital encounter of 08/27/16 (from the past 24 hour(s))  CBC   Collection Time: 08/27/16  1:48 AM  Result Value Ref Range   WBC 12.2 (H) 4.0 - 10.5 K/uL   RBC 4.15 3.87 - 5.11 MIL/uL   Hemoglobin 10.6 (L) 12.0 - 15.0 g/dL   HCT 78.232.9 (L) 95.636.0 - 21.346.0 %   MCV 79.3 78.0 - 100.0 fL   MCH 25.5 (L) 26.0 - 34.0 pg   MCHC 32.2 30.0 - 36.0 g/dL   RDW 08.615.5 57.811.5 - 46.915.5 %   Platelets 285 150 - 400 K/uL  Comprehensive metabolic panel   Collection Time: 08/27/16  1:48 AM  Result Value Ref Range   Sodium 134 (L) 135 - 145 mmol/L   Potassium 3.7 3.5 - 5.1 mmol/L   Chloride 104 101 - 111 mmol/L   CO2 19 (L) 22 - 32 mmol/L   Glucose, Bld 101 (H) 65 - 99 mg/dL   BUN 9 6 - 20 mg/dL   Creatinine, Ser 6.290.60 0.44 - 1.00 mg/dL   Calcium 8.5 (L) 8.9 - 10.3 mg/dL   Total Protein 6.7 6.5 - 8.1 g/dL   Albumin 2.8 (L) 3.5 - 5.0 g/dL   AST 19 15 - 41 U/L   ALT 8 (L) 14 - 54 U/L   Alkaline Phosphatase 134 (H) 38 - 126 U/L   Total Bilirubin 0.1 (L) 0.3 - 1.2 mg/dL   GFR calc non Af Amer >60 >60 mL/min   GFR calc Af Amer >60 >60 mL/min   Anion gap 11 5 - 15  Protein / creatinine ratio, urine   Collection Time: 08/27/16  1:50 AM  Result Value Ref Range   Creatinine, Urine 84.00 mg/dL   Total Protein, Urine <6 mg/dL   Protein Creatinine Ratio        0.00 - 0.15 mg/mg[Cre]    Patient Active Problem List   Diagnosis Date Noted  . LGA (large for gestational age) fetus affecting management of mother, third trimester, other fetus 08/24/2016  . Maternal obesity, antepartum 07/31/2016  . Thyroid dysfunction in pregnancy, antepartum 05/24/2016  . Supervision of high risk pregnancy, antepartum  01/19/2016  . Thyroid nodule 01/16/2016  .  Vitamin D deficiency 01/16/2016  . Hashimoto's thyroiditis 09/23/2015  . PCO (polycystic ovaries) 09/23/2015  . Fibromyalgia 09/19/2015  . Cervical paraspinal muscle spasm 09/19/2015    Assessment: Shannon Mckee is a 30 y.o. G3P2002 at [redacted]w[redacted]d here for IOL due to gHTN  #Labor: Bishop score 4- augment cervical ripening with cytotec #Pain: May request IV pain meds #FWB: Cat I #ID:  GBS neg #MOF: breast #MOC: vas #Circ:  outpatient  Clearance Coots 08/27/2016, 3:35 AM   OB FELLOW HISTORY AND PHYSICAL ATTESTATION  I have seen and examined this patient; I agree with above documentation in the resident's note. Patient initially came into MAU for labor evaluation, was found to have persistently elevated BPs in 150s/90s (normotensive during pregnancy). Her preE labs were negative and she has no signs/symptoms of preE. Due to patient being 40 2/7 weeks with elevated pressures, it is in the best interest for the patient and baby's health to undergo induction of labor for GHTN.    Jen Mow, DO OB Fellow 08/27/2016, 6:23 AM

## 2016-08-27 NOTE — MAU Note (Signed)
Pt c/o contractions 4-5 mins for 1.5 hours. Denies LOF or vag bleeding. +FM, although less over the last hour. Cervix was 1.5cm last Tuesday.

## 2016-08-27 NOTE — Anesthesia Postprocedure Evaluation (Signed)
Anesthesia Post Note  Patient: Shannon DecampYolanda Mckee  Procedure(s) Performed: * No procedures listed *  Patient location during evaluation: Mother Baby Anesthesia Type: Epidural Level of consciousness: awake, awake and alert, oriented and patient cooperative Pain management: pain level controlled Vital Signs Assessment: post-procedure vital signs reviewed and stable Respiratory status: spontaneous breathing, nonlabored ventilation and respiratory function stable Cardiovascular status: stable Postop Assessment: patient able to bend at knees, no headache, no backache and no signs of nausea or vomiting Anesthetic complications: no        Last Vitals:  Vitals:   08/27/16 1446 08/27/16 1510  BP: 130/62 128/80  Pulse: 86 88  Resp: 18 18  Temp:  36.7 C    Last Pain:  Vitals:   08/27/16 1510  TempSrc:   PainSc: 1    Pain Goal: Patients Stated Pain Goal: 3 (08/27/16 1510)               Bettejane Leavens L

## 2016-08-27 NOTE — Anesthesia Procedure Notes (Signed)
Epidural Patient location during procedure: OB Start time: 08/27/2016 10:05 AM  Staffing Anesthesiologist: Mal AmabileFOSTER, Henrick Mcgue  Preanesthetic Checklist Completed: patient identified, site marked, surgical consent, pre-op evaluation, timeout performed, IV checked, risks and benefits discussed and monitors and equipment checked  Epidural Patient position: sitting Prep: site prepped and draped and DuraPrep Patient monitoring: continuous pulse ox and blood pressure Approach: midline Location: L3-L4 Injection technique: LOR air  Needle:  Needle type: Tuohy  Needle gauge: 17 G Needle length: 9 cm and 9 Needle insertion depth: 5 cm cm Catheter type: closed end flexible Catheter size: 19 Gauge Catheter at skin depth: 10 cm Test dose: negative and Other  Assessment Events: blood not aspirated, injection not painful, no injection resistance, negative IV test and no paresthesia  Additional Notes Patient identified. Risks and benefits discussed including failed block, incomplete  Pain control, post dural puncture headache, nerve damage, paralysis, blood pressure Changes, nausea, vomiting, reactions to medications-both toxic and allergic and post Partum back pain. All questions were answered. Patient expressed understanding and wished to proceed. Sterile technique was used throughout procedure. Epidural site was Dressed with sterile barrier dressing. No paresthesias, signs of intravascular injection Or signs of intrathecal spread were encountered.  Patient was more comfortable after the epidural was dosed. Please see RN's note for documentation of vital signs and FHR which are stable.

## 2016-08-27 NOTE — Progress Notes (Signed)
   Subjective: Reports feeling decrease in pain after epidural.  Objective: BP 133/68   Pulse 94   Temp 98.9 F (37.2 C) (Oral)   Resp 20   Ht 5\' 4"  (1.626 m)   Wt 245 lb (111.1 kg)   LMP 11/09/2015 (LMP Unknown)   SpO2 99%   BMI 42.05 kg/m  No intake/output data recorded. No intake/output data recorded.  FHT:  FHR: 132-152 bpm, variability: moderate,  accelerations:  Present,  decelerations:  Present decels after contractions for approximately 10 min; discontinues after right tilt and IV bolus UC:   Regular, every 2-4 minutes SVE:   Dilation: 6 Effacement (%): 60 Station: -2, -3 Exam by:: WKarem, cnm; BBOW  Labs: Lab Results  Component Value Date   WBC 12.0 (H) 08/27/2016   HGB 10.2 (L) 08/27/2016   HCT 31.7 (L) 08/27/2016   MCV 79.1 08/27/2016   PLT 262 08/27/2016    Assessment / Plan: IOL for GHTN; progressing well with cytotec  Labor: Progressing normally; consider AROM with next exam Preeclampsia:  labs stable Fetal Wellbeing:  Category II Pain Control:  Epidural I/D:  GBS neg Anticipated MOD:  NSVD  Rochele PagesWalidah Karim 08/27/2016, 11:00 AM

## 2016-08-27 NOTE — Progress Notes (Signed)
  Subjective: Called by RN and notified regarding decelerations.  Pt reporting increased pressure.    Objective: BP (!) 138/59   Pulse 89   Temp 97.9 F (36.6 C) (Oral)   Resp 18   Ht 5\' 4"  (1.626 m)   Wt 245 lb (111.1 kg)   LMP 11/09/2015 (LMP Unknown)   SpO2 99%   BMI 42.05 kg/m  No intake/output data recorded. No intake/output data recorded.  FHT:  FHR: 145 bpm, variability: moderate,  accelerations:  Present,  decelerations:  Present decels to 100's for 1-1.5 min with contractions with return to baselin UC:   regular, every 2-2.5 minutes SVE:   Dilation: 6.5 Effacement (%): 70 Station: 0 Exam by:: WKarim,cnm AROM, nonparticulate meconium; IUPC and FSE placed without difficulty  Labs: Lab Results  Component Value Date   WBC 12.0 (H) 08/27/2016   HGB 10.2 (L) 08/27/2016   HCT 31.7 (L) 08/27/2016   MCV 79.1 08/27/2016   PLT 262 08/27/2016    Assessment / Plan: Augmentation of Labor; AROM  Decelerations  Labor: AROM to assist in labor pattern Preeclampsia:  labs stable Fetal Wellbeing:  Category II; will do amnioinfusion if decels continue Pain Control:  Epidural I/D:  GBS neg Anticipated MOD:  NSVD  Rochele PagesWalidah Karim 08/27/2016, 12:15 PM

## 2016-08-27 NOTE — Anesthesia Preprocedure Evaluation (Signed)
Anesthesia Evaluation  Patient identified by MRN, date of birth, ID band Patient awake    Reviewed: Allergy & Precautions, Patient's Chart, lab work & pertinent test results  Airway Mallampati: III       Dental no notable dental hx. (+) Teeth Intact   Pulmonary asthma ,    Pulmonary exam normal breath sounds clear to auscultation       Cardiovascular hypertension, Normal cardiovascular exam Rhythm:Regular Rate:Normal     Neuro/Psych Anxiety negative neurological ROS     GI/Hepatic negative GI ROS, Neg liver ROS,   Endo/Other  Morbid obesityHx/o Hashimoto's thyroiditis  Renal/GU negative Renal ROS  negative genitourinary   Musculoskeletal  (+) Fibromyalgia -  Abdominal (+) + obese,   Peds  Hematology  (+) anemia ,   Anesthesia Other Findings   Reproductive/Obstetrics (+) Pregnancy                             Lab Results  Component Value Date   WBC 12.0 (H) 08/27/2016   HGB 10.2 (L) 08/27/2016   HCT 31.7 (L) 08/27/2016   MCV 79.1 08/27/2016   PLT 262 08/27/2016     Chemistry      Component Value Date/Time   NA 134 (L) 08/27/2016 0148   K 3.7 08/27/2016 0148   CL 104 08/27/2016 0148   CO2 19 (L) 08/27/2016 0148   BUN 9 08/27/2016 0148   CREATININE 0.60 08/27/2016 0148      Component Value Date/Time   CALCIUM 8.5 (L) 08/27/2016 0148   ALKPHOS 134 (H) 08/27/2016 0148   AST 19 08/27/2016 0148   ALT 8 (L) 08/27/2016 0148   BILITOT 0.1 (L) 08/27/2016 0148      Anesthesia Physical Anesthesia Plan  ASA: III  Anesthesia Plan: Epidural   Post-op Pain Management:    Induction:   Airway Management Planned: Natural Airway  Additional Equipment:   Intra-op Plan:   Post-operative Plan:   Informed Consent: I have reviewed the patients History and Physical, chart, labs and discussed the procedure including the risks, benefits and alternatives for the proposed anesthesia  with the patient or authorized representative who has indicated his/her understanding and acceptance.     Plan Discussed with: Anesthesiologist  Anesthesia Plan Comments:         Anesthesia Quick Evaluation

## 2016-08-27 NOTE — Progress Notes (Signed)
Patient ID: Shannon Mckee, female   DOB: 1986/02/18, 30 y.o.   MRN: [id number]  S: Patient seen & examined for progress of labor. Patient comfortable in bed.    O:  Vitals:   08/27/16 0248 08/27/16 0308 08/27/16 0343 08/27/16 0557  BP: 153/90 (!) 147/102 (!) 153/92 (!) 155/88  Pulse: 91 87 75 94  Resp:   18 16  Temp:   98.5 F (36.9 C)   TempSrc:   Oral   Weight:   245 lb (111.1 kg)   Height:   5\' 4"  (1.626 m)     Dilation: 2.5 Effacement (%): 60 Cervical Position: Posterior Station: -3 Presentation: Vertex Exam by:: Dr. Omer JackMumaw  Foley bulb placed without difficulty.  FHT: 135 bpm, mod var, +accels, no decels TOCO: Infrequent, about q3-6 min   A/P: FB placed Continue with cytotec BPs elevated but not in severe range Without headache, changes in vision, or RUQ/epigastric pain Continue expectant management Anticipate SVD

## 2016-08-28 ENCOUNTER — Encounter: Payer: Medicaid Other | Admitting: Certified Nurse Midwife

## 2016-08-28 MED ORDER — FERROUS SULFATE 325 (65 FE) MG PO TABS
325.0000 mg | ORAL_TABLET | Freq: Three times a day (TID) | ORAL | Status: DC
  Administered 2016-08-28 – 2016-08-29 (×4): 325 mg via ORAL
  Filled 2016-08-28 (×4): qty 1

## 2016-08-28 NOTE — Clinical Social Work Maternal (Signed)
CLINICAL SOCIAL WORK MATERNAL/CHILD NOTE  Patient Details  Name: Shannon Mckee MRN: [id number] Date of Birth: 04-23-1986  Date:  08/28/2016  Clinical Social Worker Initiating Note:  Terri Piedra, [place] Date/ Time Initiated:  08/28/16/1130     Child's Name:  Josephina Gip   Legal Guardian:  Other (Comment) (Parents: Denman George and Enid Baas)   Need for Interpreter:  None   Date of Referral:  08/28/16     Reason for Referral:  Other (Comment) (Hx of Anx)   Referral Source:  [facility]   Address:  [address], [place], [place] [address]  Phone number:  [phone]   Household Members:  Spouse, Minor Children (Parents live with their two other children, ages 4.5 and 33 months.)   Natural Supports (not living in the home):  Parent, Friends, Social worker, Extended Family, Education administrator, Immediate Family (MOB reports having a great support system.)   Professional Supports:     Employment: Full-time   Type of Work:  (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  MOB reports having a close church family she identifies as supportive.  Strengths:  Ability to meet basic needs , Home prepared for child , Pediatrician chosen  ([place])   Risk Factors/Current Problems:  Mental Health Concerns  (Hx of Anx.)   Cognitive State:  Able to Concentrate , Alert , Goal Oriented , Insightful , Linear Thinking    Mood/Affect:  Interested , Calm , Comfortable    CSW Assessment: CSW met with MOB in her first floor room/133 to offer support and complete assessment due to hx of Anx.  MGM was visiting with MOB.  MOB was pleasant, welcoming, easy to engage and gave permission to talk openly with MGM present. MOB reports she felt a little overwhelmed when she found out she was pregnant, given that she now has 3 children under 5, but  also states that they are happy about their new baby.  She reports that she has great supports and that her parents and sister are currently visiting from [place] until [place] and that her sister-in-law is home from [place] until mid-January.  MOB reports that PGM lives locally and is supportive.  She is currently caring for the couple's other children so that MOB's family can be with her and the new baby in the hospital.   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  She states they have agreed that he will not take a job that takes him out of town so that he can be home each night.   CSW provided education regarding PMADs and stressed the importance of talking with a medical professional if MOB has concerns about her mental health at any time.  MOB was attentive and agreeable.  She reports no hx of PMAD after her last deliveries.  She reports hx of Anxiety and use of an antidepressant in college.  She states she is feeling well emotionally at this time and denies need for mental health treatment currently.  She commits to talking with her doctor if she has concerns.  CSW informed her of "Mom Talk" support group at [facility] and gave her a "New Mom Checklist" as a way of self evaluation.  MOB was appreciative and  states no questions, concerns or needs.  CSW Plan/Description:  No Further Intervention Required/No Barriers to Discharge, Patient/Family Education     Alphonzo Cruise, [place] 08/28/2016, 2:31 PM

## 2016-08-28 NOTE — Progress Notes (Signed)
Post Partum Day #1 Subjective: no complaints, up ad lib, voiding and tolerating PO  Objective: Blood pressure 127/78, pulse 65, temperature 98.6 F (37 C), temperature source Oral, resp. rate 16, height 5\' 4"  (1.626 m), weight 245 lb (111.1 kg), last menstrual period 11/09/2015, SpO2 100 %, unknown if currently breastfeeding.  Physical Exam:  General: alert, cooperative and no distress Lochia: appropriate Uterine Fundus: firm Incision: no significant drainage, no dehiscence, no significant erythema DVT Evaluation: No evidence of DVT seen on physical exam. No cords or calf tenderness. No significant calf/ankle edema.   Recent Labs  08/27/16 0148 08/27/16 0931  HGB 10.6* 10.2*  HCT 32.9* 31.7*    Assessment/Plan: Plan for discharge tomorrow and Contraception vasectomy for spouse B/P stable.  Anemia: iron started.    LOS: 1 day   Roe CoombsRachelle A Dasiah Hooley, CNM 08/28/2016, 7:35 AM

## 2016-08-29 MED ORDER — SENNOSIDES-DOCUSATE SODIUM 8.6-50 MG PO TABS: 2.0000 | ORAL_TABLET | Freq: Two times a day (BID) | ORAL | 1 refills | Status: DC

## 2016-08-29 MED ORDER — FERROUS SULFATE 325 (65 FE) MG PO TABS: 325.0000 mg | ORAL_TABLET | Freq: Three times a day (TID) | ORAL | 3 refills | Status: DC

## 2016-08-29 MED ORDER — OXYCODONE-ACETAMINOPHEN 5-325 MG PO TABS: 1.0000 | ORAL_TABLET | ORAL | 0 refills | Status: DC | PRN

## 2016-08-29 NOTE — Progress Notes (Addendum)
Post Partum Day #2 Subjective: no complaints, up ad lib, voiding and tolerating PO  Objective: Blood pressure 132/76, pulse (!) 56, temperature 98.2 F (36.8 C), temperature source Oral, resp. rate 18, height 5\' 4"  (1.626 m), weight 245 lb (111.1 kg), last menstrual period 11/09/2015, SpO2 99 %, unknown if currently breastfeeding.  Physical Exam:  General: alert, cooperative and no distress Lochia: appropriate Uterine Fundus: firm Incision: no significant drainage, no dehiscence, no significant erythema, 2nd degree lac.  DVT Evaluation: No evidence of DVT seen on physical exam. No cords or calf tenderness. No significant calf/ankle edema.   Recent Labs  08/27/16 0148 08/27/16 0931  HGB 10.6* 10.2*  HCT 32.9* 31.7*    Assessment/Plan: Discharge home and Contraception spouse vasectomy  VSS: no blood pressure medications needed.  Anemia: asymptomatic, iron prescribed.    LOS: 2 days   Roe CoombsRachelle A Kendelle Schweers, CNM 08/29/2016, 7:46 AM

## 2016-08-29 NOTE — Plan of Care (Signed)
Problem: Life Cycle: Goal: Risk for postpartum hemorrhage will decrease Outcome: Completed/Met Date Met: 08/29/16 VSS; fundus firm and scant-small lochia with no clots. Denies severe abdominal pain, dizziness, or excessive bleeding.    

## 2016-08-29 NOTE — Discharge Summary (Signed)
Obstetric Discharge Summary Reason for Admission: induction of labor and [facility] Prenatal Procedures: NST and ultrasound Intrapartum Procedures: spontaneous vaginal delivery  At 1:01 PM a viable and healthy female was delivered via Vaginal, Spontaneous Delivery (Presentation: direct OA).  Shoulder dystocia lasting approximately 40 seconds resolved with McRobert's and modified Woods;  APGAR: 9, 9; weight:  9lb 2 oz .   Placenta status: intact ,   Anesthesia:  Epidural Episiotomy: None Lacerations: 2nd degree Suture Repair: 3.0 Monocryl Est. Blood Loss (mL):  400 Postpartum Procedures: none Complications-Operative and Postpartum: 2nd degree perineal laceration Hemoglobin  Date Value Ref Range Status  08/27/2016 10.2 (L) 12.0 - 15.0 g/dL Final   HCT  Date Value Ref Range Status  08/27/2016 31.7 (L) 36.0 - 46.0 % Final   Hematocrit  Date Value Ref Range Status  05/24/2016 34.8 34.0 - 46.6 % Final    Physical Exam:  General: alert, cooperative and no distress Lochia: appropriate Uterine Fundus: firm Incision: no significant drainage, no dehiscence, no significant erythema DVT Evaluation: No evidence of DVT seen on physical exam. No cords or calf tenderness. No significant calf/ankle edema.  Discharge Diagnoses: Term Pregnancy-delivered and GHTN, Anemia  Discharge Information: Date: 08/29/2016 Activity: pelvic rest Diet: routine Medications: PNV, Ibuprofen, Colace, Iron and Percocet Condition: stable Instructions: refer to practice specific booklet Discharge to: home Follow-up Information    Shannon Mckee, CNM Follow up in 4 week(s).   Specialty:  Certified Nurse Midwife Contact information: [address] [place] [address] [phone]           Newborn Data: Live born female  Birth Weight: 9 lb 2.9 oz (4165 g) APGAR: 9, 9  Home with mother.  Shannon Mckee, CNM 08/29/2016, 7:50 AM

## 2016-09-27 ENCOUNTER — Ambulatory Visit: Payer: Medicaid Other | Admitting: Obstetrics and Gynecology

## 2016-10-01 ENCOUNTER — Ambulatory Visit (INDEPENDENT_AMBULATORY_CARE_PROVIDER_SITE_OTHER): Payer: Medicaid Other | Admitting: Obstetrics and Gynecology

## 2016-10-01 DIAGNOSIS — O0990 Supervision of high risk pregnancy, unspecified, unspecified trimester: Secondary | ICD-10-CM

## 2016-10-01 NOTE — Progress Notes (Signed)
..  Post Partum Exam  [name] is a 31 y.o. [id number] female who presents for a postpartum visit. She is 5 weeks postpartum following a spontaneous vaginal delivery. I have fully reviewed the prenatal and intrapartum course. The delivery was at 40.2 gestational weeks.  Anesthesia: epidural. Postpartum course has been complicated by GHTN. Patient was discharged on no meds. Baby's course has been good Baby is feeding by bottle. Bleeding no bleeding. Bowel function is normal. Bladder function is normal. Patient is sexually active. Contraception method is condoms. Postpartum depression screening:neg    Review of Systems Pertinent items are noted in HPI.    Objective:    BP 116/78 mmHg  Pulse 78  Resp 16  Ht 5\' 5"  (1.651 m)  Wt 211 lb (95.709 kg)  BMI 35.11 kg/m2  Breastfeeding? Yes  General:  alert, cooperative and no distress   Breasts:  inspection negative, no nipple discharge or bleeding, no masses or nodularity palpable  Lungs: clear to auscultation bilaterally  Heart:  regular rate and rhythm  Abdomen: soft, non-tender; bowel sounds normal; no masses,  no organomegaly   Vulva:  normal  Vagina: normal vagina, no discharge, exudate, lesion, or erythema  Cervix:  multiparous appearance  Corpus: normal size, contour, position, consistency, mobility, non-tender  Adnexa:  normal adnexa and no mass, fullness, tenderness  Rectal Exam: Not performed.        Assessment:    Normal postpartum exam. Pap smear not done at today's visit.   Plan:   1. Contraception: condoms and vasectomy 2. Patient is medically cleared to resume all activities. 3. Follow up in: 6 months for annual exam or as needed.

## 2017-09-27 ENCOUNTER — Ambulatory Visit: Payer: Self-pay | Admitting: Psychology

## 2017-10-02 ENCOUNTER — Ambulatory Visit (INDEPENDENT_AMBULATORY_CARE_PROVIDER_SITE_OTHER): Payer: Commercial Managed Care - PPO | Admitting: Psychology

## 2017-10-02 DIAGNOSIS — F3181 Bipolar II disorder: Secondary | ICD-10-CM | POA: Diagnosis not present

## 2017-10-02 DIAGNOSIS — F411 Generalized anxiety disorder: Secondary | ICD-10-CM

## 2017-10-17 ENCOUNTER — Ambulatory Visit: Payer: Self-pay | Admitting: Medical

## 2017-10-21 ENCOUNTER — Ambulatory Visit (INDEPENDENT_AMBULATORY_CARE_PROVIDER_SITE_OTHER): Payer: Commercial Managed Care - PPO | Admitting: Medical

## 2017-10-21 ENCOUNTER — Encounter: Payer: Self-pay | Admitting: Medical

## 2017-10-21 VITALS — BP 141/94 | HR 88 | Temp 98.5°F | Resp 16 | Ht 64.0 in | Wt 261.4 lb

## 2017-10-21 DIAGNOSIS — F319 Bipolar disorder, unspecified: Secondary | ICD-10-CM | POA: Diagnosis not present

## 2017-10-21 DIAGNOSIS — F419 Anxiety disorder, unspecified: Secondary | ICD-10-CM | POA: Diagnosis not present

## 2017-10-21 DIAGNOSIS — E282 Polycystic ovarian syndrome: Secondary | ICD-10-CM

## 2017-10-21 DIAGNOSIS — E039 Hypothyroidism, unspecified: Secondary | ICD-10-CM | POA: Diagnosis not present

## 2017-10-21 MED ORDER — ALPRAZOLAM 0.5 MG PO TABS: 0.5000 mg | ORAL_TABLET | Freq: Two times a day (BID) | ORAL | 0 refills | Status: AC | PRN

## 2017-10-21 NOTE — Progress Notes (Signed)
Subjective:    Patient ID: Shannon Mckee, female    DOB: March 01, 1986, 32 y.o.   MRN: [id number]  HPI  Pt in for follow up.  Pt gives me update of hx of fibromyalgia, low thyroid, and some depression/bipolar disorder..  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   Pt some daily stress. Some related to 32 yo with autism  Pt states not recently real depressed. Most of time she has anxiety and some loosing her temper. She dose report chronic daily  anxiety but with episodes of severe anxiety.  Over last year after birth of third child has increased anxiety.  Pt had issues of anxiety since teenager. Pt in college tried some antidepressants but they did not help.  Pt did not do well with clonopin. Even lowest dose put her to sleep excessively.  Xanax did not oversedate her like clonopin  in the past. She states she did well with 0.25-.5 mg. Pt was given sertraline in the past. It did not help much. Pt not sure if she ever used effexor.  Pt at one point saw psychiatrist who gave her gabapentin, sertraline and clonopin. She felt oversedated. This was in February 2012. Pt is interested in seeing psychiatrist again.  LMP-January 20,2019. Pt sees GYN. She has pcos.  Pt last tsh checked in November and was normal. Pt has history of varied levels. Pt has seen local endocrinologist and some nodules are being followed.    Review of Systems  HENT: Negative for congestion, postnasal drip, sinus pressure, sinus pain and sore throat.   Respiratory: Negative for apnea, cough, chest tightness, shortness of breath and wheezing.   Cardiovascular: Negative for chest pain and palpitations.  Gastrointestinal: Negative for abdominal pain.  Musculoskeletal: Negative for back pain.       Some diffuse body aches.  Skin: Negative for pallor and rash.  Neurological: Negative for dizziness, syncope, speech difficulty, weakness and headaches.  Hematological: Negative for adenopathy. Does not  bruise/bleed easily.  Psychiatric/Behavioral: Negative for behavioral problems, confusion, dysphoric mood, self-injury, sleep disturbance and suicidal ideas. The patient is nervous/anxious.        Stress.    Past Medical History:  Diagnosis Date  . Anemia    Pregnancy related  . Anxiety   . Asthma   . Fibromyalgia   . Hashimotos thyroiditis   . History of PCOS      Social History   Socioeconomic History  . Marital status: Married    Spouse name: Not on file  . Number of children: Not on file  . Years of education: Not on file  . Highest education level: Not on file  Social Needs  . Financial resource strain: Not on file  . Food insecurity - worry: Not on file  . Food insecurity - inability: Not on file  . Transportation needs - medical: Not on file  . Transportation needs - non-medical: Not on file  Occupational History  . Not on file  Tobacco Use  . Smoking status: Never Smoker  . Smokeless tobacco: Never Used  Substance and Sexual Activity  . Alcohol use: No    Alcohol/week: 0.0 oz  . Drug use: No  . Sexual activity: Yes  Other Topics Concern  . Not on file  Social History Narrative  . Not on file    Past Surgical History:  Procedure Laterality Date  . ANKLE SURGERY     x 2   . APPENDECTOMY    . CHOLECYSTECTOMY    .  TONSILLECTOMY AND ADENOIDECTOMY      No family history on file.  Allergies  Allergen Reactions  . Aspirin Palpitations  . Cefixime Anaphylaxis    Unknown reaction  . Latex Hives    Hive Palpitations  Hive Palpitations   . Sulfa Antibiotics Hives and Rash    Palpitations Palpitations    No current outpatient medications on file prior to visit.   No current facility-administered medications on file prior to visit.     BP (!) 141/94   Pulse 88   Temp 98.5 F (36.9 C) (Oral)   Resp 16   Ht 5\' 4"  (1.626 m)   Wt 261 lb 6.4 oz (118.6 kg)   LMP 09/22/2017   SpO2 100%   BMI 44.87 kg/m       Objective:   Physical  Exam  General Mental Status- Alert. General Appearance- Not in acute distress.   Skin General: Color- Normal Color. Moisture- Normal Moisture.  Neck Carotid Arteries- Normal color. Moisture- Normal Moisture. No carotid bruits. No JVD.  Chest and Lung Exam Auscultation: Breath Sounds:-Normal.  Cardiovascular Auscultation:Rythm- Regular. Murmurs & Other Heart Sounds:Auscultation of the heart reveals- No Murmurs.  Abdomen Inspection:-Inspeection Normal. Palpation/Percussion:Note:No mass. Palpation and Percussion of the abdomen reveal- Non Tender, Non Distended + BS, no rebound or guarding.    Neurologic Cranial Nerve exam:- CN III-XII intact(No nystagmus), symmetric smile. Drift Test:- No drift. Romberg Exam:- Negative.  Heal to Toe Gait exam:-Normal. Finger to Nose:- Normal/Intact Strength:- 5/5 equal and symmetric strength both upper and lower extremities.      Assessment & Plan:  For your history of anxiety and depression. I am going to rx xanax tabs. Will give you limited number of tabs and then need to follow up in 2 weeks to see how you are doing. If doing well may need to put you on contract receive this monthly.  Will refer you to psychiatrist. Giving you list of them. Please call and make contact to establish care then call us and let us know who you chose.  Continue to folow up with gyn for pcos.  Continue to follow up with endocrinologist for hx of low tsh.    Follow up in 2 weeks or as needed  [employer], PA-C

## 2017-10-21 NOTE — Patient Instructions (Addendum)
For your history of anxiety and depression. I am going to rx xanax tabs. Will give you limited number of tabs and then need to follow up in 2 weeks to see how you are doing. If doing well may need to put you on contract receive this monthly.  Will refer you to psychiatrist. Giving you list of them. Please call and make contact to establish care then call us and let us know who you chose.  Continue to folow up with gyn for pcos.  Continue to follow up with endocrinologist for hx of low tsh.    Follow up in 2 weeks or as needed  If any severe mood changes. Thoughts of harm to self or others then be [facility].

## 2017-10-30 ENCOUNTER — Ambulatory Visit (INDEPENDENT_AMBULATORY_CARE_PROVIDER_SITE_OTHER): Payer: Commercial Managed Care - PPO | Admitting: Psychology

## 2017-10-30 DIAGNOSIS — F3181 Bipolar II disorder: Secondary | ICD-10-CM | POA: Diagnosis not present

## 2017-11-04 ENCOUNTER — Ambulatory Visit (INDEPENDENT_AMBULATORY_CARE_PROVIDER_SITE_OTHER): Payer: Commercial Managed Care - PPO | Admitting: Medical

## 2017-11-04 ENCOUNTER — Encounter: Payer: Self-pay | Admitting: Medical

## 2017-11-04 VITALS — BP 127/71 | HR 75 | Temp 98.7°F | Resp 16 | Ht 64.0 in | Wt 262.2 lb

## 2017-11-04 DIAGNOSIS — F419 Anxiety disorder, unspecified: Secondary | ICD-10-CM

## 2017-11-04 DIAGNOSIS — F319 Bipolar disorder, unspecified: Secondary | ICD-10-CM | POA: Diagnosis not present

## 2017-11-04 DIAGNOSIS — M2550 Pain in unspecified joint: Secondary | ICD-10-CM | POA: Diagnosis not present

## 2017-11-04 LAB — C-REACTIVE PROTEIN: CRP: 0.7 mg/dL (ref 0.5–20.0)

## 2017-11-04 LAB — SEDIMENTATION RATE: SED RATE: 20 mm/h (ref 0–20)

## 2017-11-04 NOTE — Progress Notes (Signed)
Subjective:    Patient ID: Shannon DecampYolanda Mckee, female    DOB: 09-09-1985, 32 y.o.   MRN: [id number]  HPI  Pt in for follow up. She was in for anxiety. She states she states less stress and and anxiety with just knowing that xanax was available. Pt has seen Terri and she has appointment with psychiatrist for May 30 th.   Pt states feeling better overall with working out. She is working out 3-4 times a week.  Pt states good fit with Terri for counseling.  Pt has hx of fibromyalgia. 5 years ago had negative work up for arthralgias.  Has elbow pain, knee pain, and ankle pain along with diffuse body aches. She wants to consider referral to rheumatologist.   Review of Systems  Constitutional: Negative for chills, diaphoresis, fatigue and fever.  HENT: Negative for congestion and ear discharge.   Respiratory: Negative for cough, chest tightness, shortness of breath and wheezing.   Cardiovascular: Negative for chest pain and palpitations.  Gastrointestinal: Negative for abdominal distention, abdominal pain, constipation, diarrhea and vomiting.  Musculoskeletal: Positive for arthralgias. Negative for back pain and neck pain.  Skin: Negative for rash.  Neurological: Negative for dizziness, syncope, weakness, light-headedness and numbness.  Hematological: Negative for adenopathy. Does not bruise/bleed easily.  Psychiatric/Behavioral: Negative for confusion, decreased concentration, dysphoric mood, self-injury, sleep disturbance and suicidal ideas. The patient is nervous/anxious.    Past Medical History:  Diagnosis Date  . Anemia    Pregnancy related  . Anxiety   . Asthma    as child and teenager. no problmes for 6 years.  . Fibromyalgia   . Hashimotos thyroiditis   . History of PCOS      Social History   Socioeconomic History  . Marital status: Married    Spouse name: Not on file  . Number of children: Not on file  . Years of education: Not on file  . Highest education level: Not  on file  Social Needs  . Financial resource strain: Not on file  . Food insecurity - worry: Not on file  . Food insecurity - inability: Not on file  . Transportation needs - medical: Not on file  . Transportation needs - non-medical: Not on file  Occupational History  . Not on file  Tobacco Use  . Smoking status: Never Smoker  . Smokeless tobacco: Never Used  Substance and Sexual Activity  . Alcohol use: No    Alcohol/week: 0.0 oz  . Drug use: No  . Sexual activity: Yes  Other Topics Concern  . Not on file  Social History Narrative  . Not on file    Past Surgical History:  Procedure Laterality Date  . ANKLE SURGERY     x 2   . APPENDECTOMY    . CHOLECYSTECTOMY    . TONSILLECTOMY AND ADENOIDECTOMY      No family history on file.  Allergies  Allergen Reactions  . Aspirin Palpitations  . Cefixime Anaphylaxis    Unknown reaction  . Latex Hives    Hive Palpitations  Hive Palpitations   . Sulfa Antibiotics Hives and Rash    Palpitations Palpitations    Current Outpatient Medications on File Prior to Visit  Medication Sig Dispense Refill  . ALPRAZolam (XANAX) 0.5 MG tablet Take 1 tablet (0.5 mg total) by mouth 2 (two) times daily as needed for anxiety. 8 tablet 0  . medroxyPROGESTERone (PROVERA) 10 MG tablet TK 1 T PO D FOR 10 CONSECUTIVE DAYS Q MONTH  10   No current facility-administered medications on file prior to visit.     BP 127/71   Pulse 75   Temp 98.7 F (37.1 C) (Oral)   Resp 16   Ht 5\' 4"  (1.626 m)   Wt 262 lb 3.2 oz (118.9 kg)   SpO2 100%   BMI 45.01 kg/m       Objective:   Physical Exam   General Mental Status- Alert. General Appearance- Not in acute distress.   Skin General: Color- Normal Color. Moisture- Normal Moisture.  Neck Carotid Arteries- Normal color. Moisture- Normal Moisture. No carotid bruits. No JVD.  Chest and Lung Exam Auscultation: Breath Sounds:-Normal.  Cardiovascular Auscultation:Rythm- Regular. Murmurs &  Other Heart Sounds:Auscultation of the heart reveals- No Murmurs.  Abdomen Inspection:-Inspeection Normal. Palpation/Percussion:Note:No mass. Palpation and Percussion of the abdomen reveal- Non Tender, Non Distended + BS, no rebound or guarding.    Neurologic Cranial Nerve exam:- CN III-XII intact(No nystagmus), symmetric smile. Strength:- 5/5 equal and symmetric strength both upper and lower extremities.  Knees, ankle and elbows- Good range of motion. No crepitus. No pain on palpation.     Assessment & Plan:  Your anxiety has recently been controlled with conservative measures.  You do have the Xanax prescription made available on last visit in the event you have worse anxiety.  Good job  controlling anxiety.  Continue with treatment/counseling and attending psychiatrist appointment  [phone].  I do want you to follow-up sometime mid to late May or as needed.  Get update on psychiatrist opinion regarding history of bipolar.  For your history of fibromyalgia and persisting joint pains, I do want to repeat panel as it has been 5 years since arthritis panel has been done.  If any factors are positive then would refer you to rheumatologist.  Follow-up mid to late May or as needed.

## 2017-11-04 NOTE — Patient Instructions (Addendum)
Your anxiety has recently been controlled with conservative measures.  You do have the Xanax prescription made available on last visit in the event you have worse anxiety.  Good job controlling anxiety.  Continue with treatment/counseling and attending psychiatrist appointment  [phone],2019.  I do want you to follow-up sometime mid to late May or as needed.  Get update on psychiatrist opinion regarding  history of bipolar.  For your history of fibromyalgia and persisting joint pains, I do want to repeat panel as it has been 5 years since arthritis panel has been done.  If any factors are positive then would refer you to rheumatologist.  Follow-up mid to late May or as needed.

## 2017-11-05 ENCOUNTER — Telehealth: Payer: Self-pay | Admitting: Medical

## 2017-11-05 DIAGNOSIS — R768 Other specified abnormal immunological findings in serum: Secondary | ICD-10-CM

## 2017-11-05 LAB — ANTI-NUCLEAR AB-TITER (ANA TITER): ANA Titer 1: 1:40 {titer} — ABNORMAL HIGH

## 2017-11-05 LAB — RHEUMATOID FACTOR

## 2017-11-05 LAB — ANA: Anti Nuclear Antibody(ANA): POSITIVE — AB

## 2017-11-05 NOTE — Telephone Encounter (Signed)
  Referral to rheumatologist placed. 

## 2017-11-06 ENCOUNTER — Encounter: Payer: Self-pay | Admitting: Medical

## 2017-11-26 ENCOUNTER — Ambulatory Visit: Payer: Commercial Managed Care - PPO | Admitting: Psychology

## 2017-12-12 ENCOUNTER — Ambulatory Visit (INDEPENDENT_AMBULATORY_CARE_PROVIDER_SITE_OTHER): Payer: Commercial Managed Care - PPO | Admitting: Psychology

## 2017-12-12 DIAGNOSIS — F3181 Bipolar II disorder: Secondary | ICD-10-CM | POA: Diagnosis not present

## 2018-01-02 ENCOUNTER — Encounter: Payer: Self-pay | Admitting: Family Medicine

## 2018-01-02 ENCOUNTER — Ambulatory Visit (INDEPENDENT_AMBULATORY_CARE_PROVIDER_SITE_OTHER): Payer: Commercial Managed Care - PPO | Admitting: Family Medicine

## 2018-01-02 VITALS — BP 120/86 | HR 86 | Temp 98.4°F | Resp 16 | Ht 64.0 in | Wt 260.0 lb

## 2018-01-02 DIAGNOSIS — R350 Frequency of micturition: Secondary | ICD-10-CM

## 2018-01-02 LAB — POC URINALSYSI DIPSTICK (AUTOMATED)
Bilirubin, UA: NEGATIVE
Blood, UA: NEGATIVE
Glucose, UA: NEGATIVE
Ketones, UA: NEGATIVE
Nitrite, UA: NEGATIVE
PH UA: 6 (ref 5.0–8.0)
PROTEIN UA: NEGATIVE
UROBILINOGEN UA: 0.2 U/dL

## 2018-01-02 MED ORDER — NITROFURANTOIN MONOHYD MACRO 100 MG PO CAPS: 100.0000 mg | ORAL_CAPSULE | Freq: Two times a day (BID) | ORAL | 0 refills | Status: DC

## 2018-01-02 NOTE — Progress Notes (Signed)
Chief Complaint  Patient presents with  . Urinary Tract Infection    frequency urination, abdominal pain, discomfort, hestitancy    Shannon Mckee is a 32 y.o. female here for possible UTI.  Duration: 1 day. Symptoms: urinary frequency, nausea and abd pain Denies: hematuria, urinary retention, fever, vomiting, dysuria, vaginal discharge Hx of recurrent UTI? No Denies new sexual partners.  ROS:  Constitutional: denies fever GU: As noted in HPI  Past Medical History:  Diagnosis Date  . Anemia    Pregnancy related  . Anxiety   . Asthma    as child and teenager. no problmes for 6 years.  . Fibromyalgia   . Hashimotos thyroiditis   . History of PCOS     BP 120/86 (BP Location: Left Arm, Patient Position: Sitting, Cuff Size: Large)   Pulse 86   Temp 98.4 F (36.9 C) (Oral)   Resp 16   Ht  (1.626 m)   Wt 260 lb (117.9 kg)   SpO2 96%   BMI 44.63 kg/m  General: Awake, alert, appears stated age HEENT: MMM Heart: RRR, no murmurs Lungs: CTAB, normal respiratory effort, no accessory muscle usage Abd: BS+, soft, ttp diffusely, worse in LLQ area, ND, no masses or organomegaly MSK: No CVA tenderness, neg Lloyd's sign Psych: Age appropriate judgment and insight  Urinary frequency - Plan: POCT Urinalysis Dipstick (Automated), nitrofurantoin, macrocrystal-monohydrate, (MACROBID) 100 MG capsule, Urine Culture  UA shows 1+ leukocyte esterase.  Will send for culture.  Discussed empirically treating versus waiting for culture versus treating constipation aspect of her IBS.  She opted for the initial option. Stay hydrated. Seek immediate care if pt starts to develop fevers, new/worsening symptoms, uncontrollable N/V. F/u prn. The patient voiced understanding and agreement to the plan.  Jilda Roche [place], DO 01/02/18 12:25 PM

## 2018-01-02 NOTE — Patient Instructions (Addendum)
We will get a urine culture and let you know if you need to continue the antibiotics. This could be due to stool building up and causing pressure against the bladder also.  Fevers or worsening symptoms while on antibiotic should prompt seeking care.  Let us know if you need anything.

## 2018-01-03 LAB — URINE CULTURE
MICRO NUMBER:: 90537344
SPECIMEN QUALITY:: ADEQUATE

## 2018-01-04 ENCOUNTER — Other Ambulatory Visit (HOSPITAL_COMMUNITY): Payer: Self-pay | Admitting: Family Medicine

## 2018-01-07 ENCOUNTER — Encounter: Payer: Self-pay | Admitting: Family Medicine

## 2018-01-07 DIAGNOSIS — R109 Unspecified abdominal pain: Secondary | ICD-10-CM

## 2018-01-08 ENCOUNTER — Encounter: Payer: Self-pay | Admitting: Gastroenterology

## 2018-01-08 MED ORDER — AMITRIPTYLINE HCL 25 MG PO TABS: 25.0000 mg | ORAL_TABLET | Freq: Every day | ORAL | 1 refills | Status: DC

## 2018-01-08 NOTE — Telephone Encounter (Signed)
Start Elavil for presumed IBS, but will also refer to GI at pt's request. She has f/u with Ramon Dredge, her reg pcp in around 1 mo, which is when I would have her return. Will instruct her not to get pregnant on this medicine, similar to rec for Xanax.

## 2018-02-03 ENCOUNTER — Ambulatory Visit (INDEPENDENT_AMBULATORY_CARE_PROVIDER_SITE_OTHER): Payer: Commercial Managed Care - PPO | Admitting: Medical

## 2018-02-03 ENCOUNTER — Encounter: Payer: Self-pay | Admitting: Medical

## 2018-02-03 VITALS — BP 130/80 | HR 94 | Temp 98.6°F | Resp 16 | Ht 64.0 in | Wt 265.4 lb

## 2018-02-03 DIAGNOSIS — F419 Anxiety disorder, unspecified: Secondary | ICD-10-CM

## 2018-02-03 DIAGNOSIS — F319 Bipolar disorder, unspecified: Secondary | ICD-10-CM

## 2018-02-03 NOTE — Progress Notes (Signed)
Subjective:    Patient ID: Shannon Mckee, female    DOB: [birth year], 32 y.o.   MRN: [id number]  HPI  Pt in for follow up.  She has appointment with psychiatrist in August. It has been hard time getting scheduled. She will keep that appointment.  Awaiting on psychiatrist opinion regarding bipolar history.  Pt states in past various meds for mood typically would oversedate her.  Pt did see Aurther Loft for counseling but will see new counselor. Pt thinks needs to be seen every other week but Aurther Loft and her schedule conflict. Currently being seen only once a month.  I gave pt 8 xanax for her to help with severe anxiety. Pt did use 4 tablets over past 2 months but still has 4 tablets left.  LMP- recent pregnancy test negative Jan 26, 2018. Pt has polycystic ovary disease. LMP- December 11, 2017.    Review of Systems  Constitutional: Negative for chills, diaphoresis, fever and unexpected weight change.  HENT: Negative for congestion and drooling.   Respiratory: Negative for cough, choking, chest tightness and wheezing.   Cardiovascular: Negative for chest pain and palpitations.  Gastrointestinal: Negative for abdominal pain.  Musculoskeletal: Negative for back pain, myalgias and neck stiffness.  Skin: Negative for rash.  Neurological: Negative for dizziness, speech difficulty, weakness, light-headedness and numbness.  Hematological: Negative for adenopathy. Does not bruise/bleed easily.  Psychiatric/Behavioral: Positive for dysphoric mood. Negative for agitation, behavioral problems, self-injury, sleep disturbance and suicidal ideas. The patient is nervous/anxious.        Pt mentions every couple of weeks will have 2-7 days when her mood is depressed.  No severe manic type episode.    Past Medical History:  Diagnosis Date  . Anemia    Pregnancy related  . Anxiety   . Asthma    as child and teenager. no problmes for 6 years.  . Fibromyalgia   . Hashimotos thyroiditis   . History of PCOS       Social History   Socioeconomic History  . Marital status: Married    Spouse name: Not on file  . Number of children: Not on file  . Years of education: Not on file  . Highest education level: Not on file  Occupational History  . Not on file  Social Needs  . Financial resource strain: Not on file  . Food insecurity:    Worry: Not on file    Inability: Not on file  . Transportation needs:    Medical: Not on file    Non-medical: Not on file  Tobacco Use  . Smoking status: Never Smoker  . Smokeless tobacco: Never Used  Substance and Sexual Activity  . Alcohol use: No    Alcohol/week: 0.0 oz  . Drug use: No  . Sexual activity: Yes  Lifestyle  . Physical activity:    Days per week: Not on file    Minutes per session: Not on file  . Stress: Not on file  Relationships  . Social connections:    Talks on phone: Not on file    Gets together: Not on file    Attends religious service: Not on file    Active member of club or organization: Not on file    Attends meetings of clubs or organizations: Not on file    Relationship status: Not on file  . Intimate partner violence:    Fear of current or ex partner: Not on file    Emotionally abused: Not on file  Physically abused: Not on file    Forced sexual activity: Not on file  Other Topics Concern  . Not on file  Social History Narrative  . Not on file    Past Surgical History:  Procedure Laterality Date  . ANKLE SURGERY     x 2   . APPENDECTOMY    . CHOLECYSTECTOMY    . TONSILLECTOMY AND ADENOIDECTOMY      No family history on file.  Allergies  Allergen Reactions  . Aspirin Palpitations  . Cefixime Anaphylaxis    Unknown reaction  . Latex Hives    Hive Palpitations  Hive Palpitations   . Sulfa Antibiotics Hives and Rash    Palpitations Palpitations    Current Outpatient Medications on File Prior to Visit  Medication Sig Dispense Refill  . ALPRAZolam (XANAX) 0.5 MG tablet Take 1 tablet (0.5 mg  total) by mouth 2 (two) times daily as needed for anxiety. 8 tablet 0  . amitriptyline (ELAVIL) 25 MG tablet Take 1 tablet (25 mg total) by mouth at bedtime. 30 tablet 1  . medroxyPROGESTERone (PROVERA) 10 MG tablet TK 1 T PO D FOR 10 CONSECUTIVE DAYS Q MONTH  10   No current facility-administered medications on file prior to visit.     BP (!) 143/85   Pulse 94   Temp 98.6 F (37 C) (Oral)   Resp 16   Ht 5\' 4"  (1.626 m)   Wt 265 lb 6.4 oz (120.4 kg)   SpO2 100%   BMI 45.56 kg/m       Objective:   Physical Exam  General Mental Status- Alert. General Appearance- Not in acute distress.   Skin General: Color- Normal Color. Moisture- Normal Moisture.  Neck Carotid Arteries- Normal color. Moisture- Normal Moisture. No carotid bruits. No JVD.  Chest and Lung Exam Auscultation: Breath Sounds:-Normal.  Cardiovascular Auscultation:Rythm- Regular. Murmurs & Other Heart Sounds:Auscultation of the heart reveals- No Murmurs.  Abdomen Inspection:-Inspeection Normal. Palpation/Percussion:Note:No mass. Palpation and Percussion of the abdomen reveal- Non Tender, Non Distended + BS, no rebound or guarding.    Neurologic Cranial Nerve exam:- CN III-XII intact(No nystagmus), symmetric smile. Strength:- 5/5 equal and symmetric strength both upper and lower extremities.      Assessment & Plan:  For severe anxiety, you can continue xanax sparingly.(if you run out of 4 tab you have left just let me)  For hx of mood disorder, see psychiatrist in August and try to find specialist that can see you sooner. But don't cancel August appointment unless you know for sure you have earlier appointment. Continue with counselor.  During the interim if you feel that your mood is staying depressed for longer then please my chart me a list of meds you have used in past along with any adverse side effects. May be able to write you low dose of a med you have never tried pending psychiatrist  evaluation.  Follow up 1 month or as needed  [employer] Margarita Bobrowski, PA-C

## 2018-02-03 NOTE — Patient Instructions (Signed)
For severe anxiety, you can continue xanax sparingly.(if you run out of 4 tab you have left just let me)  For hx of mood disorder, see psychiatrist in August and try to find specialist that can see you sooner. But don't cancel August appointment unless you know for sure you have earlier appointment. Continue with counselor.  During the interim if you feel that your mood is staying depressed for longer then please my chart me a list of meds you have used in past along with any adverse side effects. May be able to write you low dose of a med you have never tried pending psychiatrist evaluation.  Follow up 1 month or as needed

## 2018-02-10 ENCOUNTER — Ambulatory Visit (INDEPENDENT_AMBULATORY_CARE_PROVIDER_SITE_OTHER): Payer: Commercial Managed Care - PPO | Admitting: Psychology

## 2018-02-10 DIAGNOSIS — F3181 Bipolar II disorder: Secondary | ICD-10-CM

## 2018-02-18 ENCOUNTER — Ambulatory Visit: Payer: Self-pay | Admitting: Gastroenterology

## 2018-03-14 ENCOUNTER — Encounter: Payer: Self-pay | Admitting: Family Medicine

## 2018-03-26 ENCOUNTER — Ambulatory Visit (INDEPENDENT_AMBULATORY_CARE_PROVIDER_SITE_OTHER): Payer: Commercial Managed Care - PPO | Admitting: Psychology

## 2018-03-26 DIAGNOSIS — F3181 Bipolar II disorder: Secondary | ICD-10-CM | POA: Diagnosis not present

## 2018-04-30 ENCOUNTER — Other Ambulatory Visit: Payer: Self-pay

## 2018-04-30 ENCOUNTER — Encounter (HOSPITAL_BASED_OUTPATIENT_CLINIC_OR_DEPARTMENT_OTHER): Payer: Self-pay | Admitting: Emergency Medicine

## 2018-04-30 ENCOUNTER — Emergency Department (HOSPITAL_BASED_OUTPATIENT_CLINIC_OR_DEPARTMENT_OTHER): Payer: Commercial Managed Care - PPO

## 2018-04-30 ENCOUNTER — Emergency Department (HOSPITAL_BASED_OUTPATIENT_CLINIC_OR_DEPARTMENT_OTHER)
Admission: EM | Admit: 2018-04-30 | Discharge: 2018-04-30 | Disposition: A | Discharge status: Home / Self Care | Payer: Commercial Managed Care - PPO | Attending: Emergency Medicine | Admitting: Emergency Medicine

## 2018-04-30 DIAGNOSIS — Z79899 Other long term (current) drug therapy: Secondary | ICD-10-CM | POA: Diagnosis not present

## 2018-04-30 DIAGNOSIS — Z9049 Acquired absence of other specified parts of digestive tract: Secondary | ICD-10-CM | POA: Insufficient documentation

## 2018-04-30 DIAGNOSIS — R1031 Right lower quadrant pain: Secondary | ICD-10-CM | POA: Insufficient documentation

## 2018-04-30 DIAGNOSIS — J45909 Unspecified asthma, uncomplicated: Secondary | ICD-10-CM | POA: Diagnosis not present

## 2018-04-30 DIAGNOSIS — R109 Unspecified abdominal pain: Secondary | ICD-10-CM

## 2018-04-30 DIAGNOSIS — F419 Anxiety disorder, unspecified: Secondary | ICD-10-CM | POA: Insufficient documentation

## 2018-04-30 DIAGNOSIS — Z91040 Latex allergy status: Secondary | ICD-10-CM | POA: Diagnosis not present

## 2018-04-30 LAB — BASIC METABOLIC PANEL
Anion gap: 11 (ref 5–15)
BUN: 12 mg/dL (ref 6–20)
CO2: 25 mmol/L (ref 22–32)
Calcium: 8.6 mg/dL — ABNORMAL LOW (ref 8.9–10.3)
Chloride: 101 mmol/L (ref 98–111)
Creatinine, Ser: 0.88 mg/dL (ref 0.44–1.00)
GFR calc Af Amer: >60 mL/min (ref >60)
GFR calc non Af Amer: >60 mL/min (ref >60)
Glucose, Bld: 95 mg/dL (ref 70–99)
Potassium: 3.3 mmol/L — ABNORMAL LOW (ref 3.5–5.1)
Sodium: 137 mmol/L (ref 135–145)

## 2018-04-30 LAB — CBC WITH DIFFERENTIAL/PLATELET
Basophils Absolute: 0 10*3/uL (ref 0.0–0.1)
Basophils Relative: 0 %
Eosinophils Absolute: 0.2 10*3/uL (ref 0.0–0.7)
Eosinophils Relative: 2 %
HCT: 37.9 % (ref 36.0–46.0)
Hemoglobin: 12.5 g/dL (ref 12.0–15.0)
Lymphocytes Relative: 31 %
Lymphs Abs: 3.1 10*3/uL (ref 0.7–4.0)
MCH: 28.2 pg (ref 26.0–34.0)
MCHC: 33 g/dL (ref 30.0–36.0)
MCV: 85.6 fL (ref 78.0–100.0)
Monocytes Absolute: 0.7 10*3/uL (ref 0.1–1.0)
Monocytes Relative: 7 %
Neutro Abs: 6.1 10*3/uL (ref 1.7–7.7)
Neutrophils Relative %: 60 %
Platelets: 279 10*3/uL (ref 150–400)
RBC: 4.43 MIL/uL (ref 3.87–5.11)
RDW: 13.6 % (ref 11.5–15.5)
WBC: 10.1 10*3/uL (ref 4.0–10.5)

## 2018-04-30 LAB — URINALYSIS, ROUTINE W REFLEX MICROSCOPIC
Bilirubin Urine: NEGATIVE
Glucose, UA: NEGATIVE mg/dL
Ketones, ur: NEGATIVE mg/dL
Leukocytes, UA: NEGATIVE
Nitrite: NEGATIVE
Protein, ur: NEGATIVE mg/dL
Specific Gravity, Urine: 1.015 (ref 1.005–1.030)
pH: 6 (ref 5.0–8.0)

## 2018-04-30 LAB — URINALYSIS, MICROSCOPIC (REFLEX)

## 2018-04-30 LAB — PREGNANCY, URINE: Preg Test, Ur: NEGATIVE

## 2018-04-30 MED ORDER — KETOROLAC TROMETHAMINE 15 MG/ML IJ SOLN
15.0000 mg | Freq: Once | INTRAMUSCULAR | Status: AC
  Administered 2018-04-30: 15 mg via INTRAVENOUS
  Filled 2018-04-30: qty 1

## 2018-04-30 MED ORDER — ONDANSETRON HCL 4 MG/2ML IJ SOLN
4.0000 mg | Freq: Once | INTRAMUSCULAR | Status: AC
Start: 2018-04-30 — End: 2018-04-30
  Administered 2018-04-30: 4 mg via INTRAVENOUS
  Filled 2018-04-30: qty 2

## 2018-04-30 MED ORDER — HYDROMORPHONE HCL 1 MG/ML IJ SOLN
1.0000 mg | Freq: Once | INTRAMUSCULAR | Status: AC
  Administered 2018-04-30: 1 mg via INTRAVENOUS
  Filled 2018-04-30: qty 1

## 2018-04-30 MED ORDER — SODIUM CHLORIDE 0.9 % IV SOLN
INTRAVENOUS | Status: DC
  Administered 2018-04-30: 21:00:00 via INTRAVENOUS

## 2018-04-30 NOTE — ED Notes (Signed)
Patient transported to CT 

## 2018-04-30 NOTE — ED Triage Notes (Signed)
Pt c/o 8/10 right flank pain, with burning sensation with urination, no blood noticed by her on the urine, no fever or chills.

## 2018-05-07 ENCOUNTER — Ambulatory Visit: Payer: Commercial Managed Care - PPO | Admitting: Psychology

## 2018-05-09 NOTE — ED Provider Notes (Signed)
[facility] EMERGENCY DEPARTMENT Provider Note   CSN: [id number] Arrival date & time: 04/30/18  1949     History   Chief Complaint Chief Complaint  Patient presents with  . Flank Pain    HPI Shannon Mckee is a 31 y.o. female.  HPI  32 year old female with right flank pain.  Gradual onset about 2 weeks ago.  Initially mild but has progressed to become more intense.  Pain is fairly constant.  She has not noticed any appreciable exacerbating relieving factors.  She is having some dysuria though.  No fevers or chills.  No unusual vaginal bleeding or discharge.  No nausea or vomiting.  She is status post appendectomy and cholecystectomy.  Past Medical History:  Diagnosis Date  . Anemia    Pregnancy related  . Anxiety   . Asthma    as child and teenager. no problmes for 6 years.  . Fibromyalgia   . Hashimotos thyroiditis   . History of PCOS     Patient Active Problem List   Diagnosis Date Noted  . Gestational hypertension affecting third pregnancy 08/27/2016  . LGA (large for gestational age) fetus affecting management of mother, third trimester, other fetus 08/24/2016  . Maternal obesity, antepartum 07/31/2016  . Thyroid dysfunction in pregnancy, antepartum 05/24/2016  . Supervision of high risk pregnancy, antepartum 01/19/2016  . Thyroid nodule 01/16/2016  . Vitamin D deficiency 01/16/2016  . Hashimoto's thyroiditis 09/23/2015  . PCO (polycystic ovaries) 09/23/2015  . Fibromyalgia 09/19/2015  . Cervical paraspinal muscle spasm 09/19/2015    Past Surgical History:  Procedure Laterality Date  . ANKLE SURGERY     x 2   . APPENDECTOMY    . CHOLECYSTECTOMY    . TONSILLECTOMY AND ADENOIDECTOMY       OB History    Gravida  3   Para  3   Term  3   Preterm      AB      Living  3     SAB      TAB      Ectopic      Multiple  0   Live Births  3            Home Medications    Prior to Admission medications   Medication Sig Start  Date End Date Taking? Authorizing Provider  ALPRAZolam Prudy Feeler) 0.5 MG tablet Take 1 tablet (0.5 mg total) by mouth 2 (two) times daily as needed for anxiety. 10/21/17   Saguier, Ramon Dredge, PA-C  amitriptyline (ELAVIL) 25 MG tablet Take 1 tablet (25 mg total) by mouth at bedtime. 01/08/18   Sharlene Dory, DO  medroxyPROGESTERone (PROVERA) 10 MG tablet TK 1 T PO D FOR 10 CONSECUTIVE DAYS Q MONTH 10/26/17   [provider]    Family History No family history on file.  Social History Social History   Tobacco Use  . Smoking status: Never Smoker  . Smokeless tobacco: Never Used  Substance Use Topics  . Alcohol use: No    Alcohol/week: 0.0 standard drinks  . Drug use: No     Allergies   Aspirin; Cefixime; Latex; and Sulfa antibiotics   Review of Systems Review of Systems  All systems reviewed and negative, other than as noted in HPI.  Physical Exam Updated Vital Signs BP 140/86 (BP Location: Right Arm)   Pulse 78   Temp 99.8 F (37.7 C) (Oral)   Resp 19   Ht 5\' 4"  (1.626 m)   Wt  117.9 kg   LMP 04/10/2018   SpO2 99%   BMI 44.63 kg/m   Physical Exam  Constitutional: She appears well-developed and well-nourished. No distress.  Bed.  No acute distress.  Obese.  HENT:  Head: Normocephalic and atraumatic.  Eyes: Conjunctivae are normal. Right eye exhibits no discharge. Left eye exhibits no discharge.  Neck: Neck supple.  Cardiovascular: Normal rate, regular rhythm and normal heart sounds. Exam reveals no gallop and no friction rub.  No murmur heard. Pulmonary/Chest: Effort normal and breath sounds normal. No respiratory distress.  Abdominal: Soft. She exhibits no distension. There is tenderness.  Tenderness in the right lower quadrant into the right flank.  No rebound or guarding.  No distention.  Musculoskeletal: She exhibits no edema or tenderness.  Neurological: She is alert.  Skin: Skin is warm and dry.  Psychiatric: She has a normal mood and affect. Her  behavior is normal. Thought content normal.  Nursing note and vitals reviewed.    ED Treatments / Results  Labs (all labs ordered are listed, but only abnormal results are displayed) Labs Reviewed  URINALYSIS, ROUTINE W REFLEX MICROSCOPIC - Abnormal; Notable for the following components:      Result Value   Hgb urine dipstick SMALL (*)    All other components within normal limits  URINALYSIS, MICROSCOPIC (REFLEX) - Abnormal; Notable for the following components:   Bacteria, UA RARE (*)    All other components within normal limits  BASIC METABOLIC PANEL - Abnormal; Notable for the following components:   Potassium 3.3 (*)    Calcium 8.6 (*)    All other components within normal limits  PREGNANCY, URINE  CBC WITH DIFFERENTIAL/PLATELET    EKG None  Radiology No results found.   Ct Renal Stone Study  Result Date: 04/30/2018 CLINICAL DATA:  32 year old female with right flank pain. Concern for kidney stone. EXAM: CT ABDOMEN AND PELVIS WITHOUT CONTRAST TECHNIQUE: Multidetector CT imaging of the abdomen and pelvis was performed following the standard protocol without IV contrast. COMPARISON:  None. FINDINGS: Evaluation of this exam is limited in the absence of intravenous contrast. Lower chest: The visualized lung bases are clear. No intra-abdominal free air or free fluid. Hepatobiliary: There is diffuse fatty infiltration of the liver. Cholecystectomy. No intrahepatic biliary ductal dilatation. No calcified stone noted in the central CBD. Pancreas: Unremarkable. No pancreatic ductal dilatation or surrounding inflammatory changes. Spleen: Normal in size without focal abnormality. Adrenals/Urinary Tract: Adrenal glands are unremarkable. Kidneys are normal, without renal calculi, focal lesion, or hydronephrosis. Bladder is unremarkable. Stomach/Bowel: There is no bowel obstruction or active inflammation. Appendectomy. Vascular/Lymphatic: The abdominal aorta and IVC are unremarkable on this  noncontrast CT. No portal venous gas. There is no adenopathy. Reproductive: The uterus and ovaries are grossly unremarkable. No pelvic mass. Other: None Musculoskeletal: No acute or significant osseous findings. IMPRESSION: 1. No acute intra-abdominopelvic pathology. Specifically there is no hydronephrosis or nephrolithiasis. 2. Fatty infiltration of the liver. 3. No bowel obstruction or active inflammation. Electronically Signed   By: Elgie Collard M.D.   On: 04/30/2018 21:39    Procedures Procedures (including critical care time)  Medications Ordered in ED Medications  ketorolac (TORADOL) 15 MG/ML injection 15 mg (15 mg Intravenous Given 04/30/18 2115)  HYDROmorphone (DILAUDID) injection 1 mg (1 mg Intravenous Given 04/30/18 2115)  ondansetron (ZOFRAN) injection 4 mg (4 mg Intravenous Given 04/30/18 2114)     Initial Impression / Assessment and Plan / ED Course  I have reviewed the triage vital  signs and the nursing notes.  Pertinent labs & imaging results that were available during my care of the patient were reviewed by me and considered in my medical decision making (see chart for details).     32 year old female with right flank pain of unclear etiology.  Labs fairly unremarkable.  UA is not particular consistent with UTI.  CT without evidence of stone, pyelonephritis or other explanatory pathology.  Status post cholecystectomy and appendectomy.  Symptoms are improved although still present.  At this point, I doubt emergent process.  Symptoms have been ongoing for about 2 weeks.  We will continue to treat symptomatically but given the duration at this point I feel that it would be appropriate to have her follow-up.  Final Clinical Impressions(s) / ED Diagnoses   Final diagnoses:  Right flank pain    ED Discharge Orders    None       Raeford Razor, MD 05/09/18 1153

## 2018-08-29 ENCOUNTER — Encounter (HOSPITAL_COMMUNITY): Payer: Self-pay | Admitting: Emergency Medicine

## 2018-08-29 ENCOUNTER — Emergency Department (HOSPITAL_COMMUNITY): Payer: Medicaid Other

## 2018-08-29 ENCOUNTER — Emergency Department (HOSPITAL_COMMUNITY)
Admission: EM | Admit: 2018-08-29 | Discharge: 2018-08-30 | Disposition: A | Discharge status: Home / Self Care | Payer: Medicaid Other | Attending: Emergency Medicine | Admitting: Emergency Medicine

## 2018-08-29 DIAGNOSIS — J45909 Unspecified asthma, uncomplicated: Secondary | ICD-10-CM | POA: Insufficient documentation

## 2018-08-29 DIAGNOSIS — R0789 Other chest pain: Secondary | ICD-10-CM

## 2018-08-29 DIAGNOSIS — Z91040 Latex allergy status: Secondary | ICD-10-CM | POA: Insufficient documentation

## 2018-08-29 DIAGNOSIS — R079 Chest pain, unspecified: Secondary | ICD-10-CM | POA: Diagnosis present

## 2018-08-29 DIAGNOSIS — J209 Acute bronchitis, unspecified: Secondary | ICD-10-CM

## 2018-08-29 LAB — I-STAT TROPONIN, ED: Troponin i, poc: 0 ng/mL (ref 0.00–0.08)

## 2018-08-29 LAB — I-STAT BETA HCG BLOOD, ED (MC, WL, AP ONLY): I-stat hCG, quantitative: <5 m[IU]/mL (ref <5)

## 2018-08-29 NOTE — ED Triage Notes (Signed)
Pt reports she was shopping and experienced sudden onset sharp stabbing pain to her substernal chest area.  It initially was relieved but now "comes in waves sometimes goes down the left arm."  She has been feeling poor for the past 24 hours.

## 2018-08-30 ENCOUNTER — Emergency Department (HOSPITAL_COMMUNITY): Payer: Medicaid Other

## 2018-08-30 ENCOUNTER — Encounter (HOSPITAL_COMMUNITY): Payer: Self-pay | Admitting: Radiology

## 2018-08-30 LAB — BASIC METABOLIC PANEL
ANION GAP: 7 (ref 5–15)
BUN: 9 mg/dL (ref 6–20)
CO2: 25 mmol/L (ref 22–32)
Calcium: 8.9 mg/dL (ref 8.9–10.3)
Chloride: 103 mmol/L (ref 98–111)
Creatinine, Ser: 0.81 mg/dL (ref 0.44–1.00)
GFR calc Af Amer: >60 mL/min (ref >60)
GFR calc non Af Amer: >60 mL/min (ref >60)
Glucose, Bld: 99 mg/dL (ref 70–99)
Potassium: 5.5 mmol/L — ABNORMAL HIGH (ref 3.5–5.1)
Sodium: 135 mmol/L (ref 135–145)

## 2018-08-30 LAB — POTASSIUM: Potassium: 3.8 mmol/L (ref 3.5–5.1)

## 2018-08-30 LAB — CBC
HCT: 42.5 % (ref 36.0–46.0)
Hemoglobin: 13.1 g/dL (ref 12.0–15.0)
MCH: 27.2 pg (ref 26.0–34.0)
MCHC: 30.8 g/dL (ref 30.0–36.0)
MCV: 88.4 fL (ref 80.0–100.0)
NRBC: 0 % (ref 0.0–0.2)
Platelets: 175 10*3/uL (ref 150–400)
RBC: 4.81 MIL/uL (ref 3.87–5.11)
RDW: 12.9 % (ref 11.5–15.5)
WBC: 11.4 10*3/uL — ABNORMAL HIGH (ref 4.0–10.5)

## 2018-08-30 LAB — I-STAT TROPONIN, ED: Troponin i, poc: 0 ng/mL (ref 0.00–0.08)

## 2018-08-30 LAB — D-DIMER, QUANTITATIVE (NOT AT ARMC): D DIMER QUANT: 0.53 ug{FEU}/mL — AB (ref 0.00–0.50)

## 2018-08-30 MED ORDER — IOPAMIDOL (ISOVUE-370) INJECTION 76%
INTRAVENOUS | Status: AC
  Administered 2018-08-30: 100 mL
  Filled 2018-08-30: qty 100

## 2018-08-30 MED ORDER — KETOROLAC TROMETHAMINE 15 MG/ML IJ SOLN
15.0000 mg | Freq: Once | INTRAMUSCULAR | Status: AC
  Administered 2018-08-30: 15 mg via INTRAVENOUS
  Filled 2018-08-30: qty 1

## 2018-08-30 MED ORDER — SODIUM CHLORIDE 0.9 % IV BOLUS
1000.0000 mL | Freq: Once | INTRAVENOUS | Status: AC
  Administered 2018-08-30: 1000 mL via INTRAVENOUS

## 2018-08-30 MED ORDER — BENZONATATE 100 MG PO CAPS: 100.0000 mg | ORAL_CAPSULE | Freq: Three times a day (TID) | ORAL | 0 refills | Status: DC | PRN

## 2018-08-30 NOTE — Discharge Instructions (Addendum)
If you develop high fever, worsening cough or coughing up blood, vomiting, shortness of breath, new or worsening chest pain, or any other new/concerning symptoms then return to the ER for evaluation.

## 2018-08-30 NOTE — ED Provider Notes (Signed)
[facility] EMERGENCY DEPARTMENT Provider Note   CSN: [id number] Arrival date & time: 08/29/18  2236     History   Chief Complaint Chief Complaint  Patient presents with  . Chest Pain    HPI Shannon Mckee is a 32 y.o. female.  HPI  32 year old female presents with chest pain.  Started around 12 hours ago.  Started having a cough and cold-like symptoms 3 or 4 days ago.  No specific myalgias besides some bilateral lower extremity muscle cramps.  No focal leg swelling but the left leg hurts a little worse than the right.  The chest pain is sharp and comes and goes.  Nothing makes it obviously come and go.  She does have some chest pain when coughing.  Feels a little short of breath and occasionally has been feeling some cramping and tightness in her left arm with tingling in her fingertips.  No history of DVT/PE.  No recent trips or surgeries.  She is not on estrogen.  Past Medical History:  Diagnosis Date  . Anemia    Pregnancy related  . Anxiety   . Asthma    as child and teenager. no problmes for 6 years.  . Fibromyalgia   . Hashimotos thyroiditis   . History of PCOS     Patient Active Problem List   Diagnosis Date Noted  . Gestational hypertension affecting third pregnancy 08/27/2016  . LGA (large for gestational age) fetus affecting management of mother, third trimester, other fetus 08/24/2016  . Maternal obesity, antepartum 07/31/2016  . Thyroid dysfunction in pregnancy, antepartum 05/24/2016  . Supervision of high risk pregnancy, antepartum 01/19/2016  . Thyroid nodule 01/16/2016  . Vitamin D deficiency 01/16/2016  . Hashimoto's thyroiditis 09/23/2015  . PCO (polycystic ovaries) 09/23/2015  . Fibromyalgia 09/19/2015  . Cervical paraspinal muscle spasm 09/19/2015    Past Surgical History:  Procedure Laterality Date  . ANKLE SURGERY     x 2   . APPENDECTOMY    . CHOLECYSTECTOMY    . TONSILLECTOMY AND ADENOIDECTOMY       OB History    Gravida  3   Para  3   Term  3   Preterm      AB      Living  3     SAB      TAB      Ectopic      Multiple  0   Live Births  3            Home Medications    Prior to Admission medications   Medication Sig Start Date End Date Taking? Authorizing Provider  ALPRAZolam Prudy Feeler) 0.5 MG tablet Take 1 tablet (0.5 mg total) by mouth 2 (two) times daily as needed for anxiety. Patient not taking: Reported on 08/30/2018 10/21/17   Saguier, Ramon Dredge, PA-C  amitriptyline (ELAVIL) 25 MG tablet Take 1 tablet (25 mg total) by mouth at bedtime. Patient not taking: Reported on 08/30/2018 01/08/18   Sharlene Dory, DO  benzonatate (TESSALON) 100 MG capsule Take 1 capsule (100 mg total) by mouth 3 (three) times daily as needed for cough. 08/30/18   Pricilla Loveless, MD    Family History No family history on file.  Social History Social History   Tobacco Use  . Smoking status: Never Smoker  . Smokeless tobacco: Never Used  Substance Use Topics  . Alcohol use: No    Alcohol/week: 0.0 standard drinks  . Drug use: No  Allergies   Aspirin; Cefixime; Latex; and Sulfa antibiotics   Review of Systems Review of Systems  Respiratory: Positive for cough and shortness of breath.   Cardiovascular: Positive for chest pain. Negative for leg swelling.  Gastrointestinal: Negative for abdominal pain and vomiting.  Musculoskeletal: Positive for myalgias.  All other systems reviewed and are negative.    Physical Exam Updated Vital Signs BP (!) 144/85   Pulse 65   Temp 98.4 F (36.9 C) (Oral)   Resp 14   Ht 5\' 4"  (1.626 m)   Wt 116.1 kg   LMP 08/12/2018 (Approximate)   SpO2 99%   BMI 43.94 kg/m   Physical Exam Vitals signs and nursing note reviewed.  Constitutional:      General: She is not in acute distress.    Appearance: She is well-developed. She is obese. She is not ill-appearing or diaphoretic.  HENT:     Head: Normocephalic and atraumatic.     Right  Ear: External ear normal.     Left Ear: External ear normal.     Nose: Nose normal.  Eyes:     General:        Right eye: No discharge.        Left eye: No discharge.  Cardiovascular:     Rate and Rhythm: Normal rate and regular rhythm.     Pulses:          Radial pulses are 2+ on the right side and 2+ on the left side.     Heart sounds: Normal heart sounds.  Pulmonary:     Effort: Pulmonary effort is normal.     Breath sounds: Normal breath sounds.  Chest:     Chest wall: Tenderness present.    Abdominal:     Palpations: Abdomen is soft.     Tenderness: There is no abdominal tenderness.  Skin:    General: Skin is warm and dry.  Neurological:     Mental Status: She is alert.  Psychiatric:        Mood and Affect: Mood is not anxious.      ED Treatments / Results  Labs (all labs ordered are listed, but only abnormal results are displayed) Labs Reviewed  BASIC METABOLIC PANEL - Abnormal; Notable for the following components:      Result Value   Potassium 5.5 (*)    All other components within normal limits  CBC - Abnormal; Notable for the following components:   WBC 11.4 (*)    All other components within normal limits  D-DIMER, QUANTITATIVE (NOT AT [facility]) - Abnormal; Notable for the following components:   D-Dimer, Quant 0.53 (*)    All other components within normal limits  POTASSIUM  I-STAT TROPONIN, ED  I-STAT BETA HCG BLOOD, ED ([facility], [facility], [facility] ONLY)  I-STAT TROPONIN, ED    EKG EKG Interpretation  Date/Time:  Friday August 29 2018 22:47:51 EST Ventricular Rate:  98 PR Interval:    QRS Duration: 81 QT Interval:  332 QTC Calculation: 424 R Axis:   66 Text Interpretation:  Sinus rhythm no acute ST/T changes No old tracing to compare Confirmed by Pricilla LovelessGoldston, Bron Snellings [phone]) on 08/30/2018 3:51:19 AM   Radiology Dg Chest 2 View  Result Date: 08/30/2018 CLINICAL DATA:  Acute onset of sternal chest pain and left-sided chest pain. Shortness of breath and cough.  Low-grade fever. EXAM: CHEST - 2 VIEW COMPARISON:  None. FINDINGS: The lungs are well-aerated. Slightly increased interstitial markings are noted. There is no  evidence of focal opacification, pleural effusion or pneumothorax. The heart is normal in size; the mediastinal contour is within normal limits. No acute osseous abnormalities are seen. IMPRESSION: Slightly increased interstitial markings noted. Lungs otherwise clear. Electronically Signed   By: Roanna RaiderJeffery  Chang M.D.   On: 08/30/2018 00:03   Ct Angio Chest Pe W And/or Wo Contrast  Result Date: 08/30/2018 CLINICAL DATA:  Acute onset of chest pain and shortness of breath that began yesterday. Elevated D-dimer. Personal history of Hashimoto's thyroiditis. EXAM: CT ANGIOGRAPHY CHEST WITH CONTRAST TECHNIQUE: Multidetector CT imaging of the chest was performed using the standard protocol during bolus administration of intravenous contrast. Multiplanar CT image reconstructions and MIPs were obtained to evaluate the vascular anatomy. CONTRAST:  100mL ISOVUE-370 IOPAMIDOL INJECTION 76% IV. COMPARISON:  No prior CT. Chest x-ray yesterday. FINDINGS: Cardiovascular: Contrast opacification of the pulmonary arteries is very good, with equivalent opacification of the pulmonary and systemic arterial systems. No filling defects within either main pulmonary artery or their segmental branches in either lung to suggest pulmonary embolism. Normal heart size. No visible coronary atherosclerosis. No pericardial effusion. No visible atherosclerosis involving the thoracic or proximal abdominal aorta or their visualized branches. Mediastinum/Nodes: No pathologically enlarged mediastinal, hilar or axillary lymph nodes. No mediastinal masses. Normal-appearing esophagus. Normal-appearing thyroid gland. Lungs/Pleura: Lung parenchyma clear without localized airspace consolidation, interstitial disease, or parenchymal nodules or masses. Central airways patent with moderate bronchial wall  thickening. No pleural effusions. Upper Abdomen: Unremarkable for the early arterial phase of enhancement which accounts for the heterogeneous enhancement of the spleen. Musculoskeletal: Mild degenerative disc disease and spondylosis involving the mid thoracic spine. No acute findings. Review of the MIP images confirms the above findings. IMPRESSION: 1. No evidence of pulmonary embolism. 2. Moderate central bronchial wall thickening indicating asthma and/or bronchitis. No acute cardiopulmonary disease otherwise. Electronically Signed   By: Hulan Saashomas  Lawrence M.D.   On: 08/30/2018 08:01    Procedures Procedures (including critical care time)  Medications Ordered in ED Medications  sodium chloride 0.9 % bolus 1,000 mL (0 mLs Intravenous Stopped 08/30/18 0617)  ketorolac (TORADOL) 15 MG/ML injection 15 mg (15 mg Intravenous Given 08/30/18 0453)  iopamidol (ISOVUE-370) 76 % injection (100 mLs  Contrast Given 08/30/18 0714)     Initial Impression / Assessment and Plan / ED Course  I have reviewed the triage vital signs and the nursing notes.  Pertinent labs & imaging results that were available during my care of the patient were reviewed by me and considered in my medical decision making (see chart for details).     Patient is mildly tachycardic on arrival and given her nonspecific symptoms I think she has to at least have a work-up for PE.  D-dimer mildly positive so CT obtained.  There is no PE on CT scan and there is no obvious pneumonia or other acute intrathoracic pathology.  Troponins are negative x2.  I doubt ACS.  Likely this is chest wall and she probably has some bronchitis as seen on CT but there is no bacterial source at this time.  Thus will treat symptomatically and have recommended Mucinex, will prescribe Tessalon, and have recommended ibuprofen/Tylenol.  Otherwise she appears stable for discharge home.  Final Clinical Impressions(s) / ED Diagnoses   Final diagnoses:  Chest wall  pain  Acute bronchitis, unspecified organism    ED Discharge Orders         Ordered    benzonatate (TESSALON) 100 MG capsule  3 times daily PRN  08/30/18 4098           Pricilla Loveless, MD 08/30/18 [phone]

## 2019-05-01 ENCOUNTER — Other Ambulatory Visit: Payer: Self-pay

## 2019-05-01 ENCOUNTER — Emergency Department (HOSPITAL_BASED_OUTPATIENT_CLINIC_OR_DEPARTMENT_OTHER): Payer: Medicaid Other

## 2019-05-01 ENCOUNTER — Emergency Department (HOSPITAL_BASED_OUTPATIENT_CLINIC_OR_DEPARTMENT_OTHER)
Admission: EM | Admit: 2019-05-01 | Discharge: 2019-05-01 | Disposition: A | Discharge status: Home / Self Care | Payer: Medicaid Other | Attending: Emergency Medicine | Admitting: Emergency Medicine

## 2019-05-01 ENCOUNTER — Encounter (HOSPITAL_BASED_OUTPATIENT_CLINIC_OR_DEPARTMENT_OTHER): Payer: Self-pay

## 2019-05-01 DIAGNOSIS — B9689 Other specified bacterial agents as the cause of diseases classified elsewhere: Secondary | ICD-10-CM | POA: Insufficient documentation

## 2019-05-01 DIAGNOSIS — N939 Abnormal uterine and vaginal bleeding, unspecified: Secondary | ICD-10-CM

## 2019-05-01 DIAGNOSIS — O2390 Unspecified genitourinary tract infection in pregnancy, unspecified trimester: Secondary | ICD-10-CM | POA: Diagnosis not present

## 2019-05-01 DIAGNOSIS — R8271 Bacteriuria: Secondary | ICD-10-CM

## 2019-05-01 DIAGNOSIS — R109 Unspecified abdominal pain: Secondary | ICD-10-CM | POA: Diagnosis not present

## 2019-05-01 DIAGNOSIS — O209 Hemorrhage in early pregnancy, unspecified: Secondary | ICD-10-CM | POA: Diagnosis not present

## 2019-05-01 DIAGNOSIS — O26899 Other specified pregnancy related conditions, unspecified trimester: Secondary | ICD-10-CM | POA: Diagnosis present

## 2019-05-01 LAB — CBC WITH DIFFERENTIAL/PLATELET
Abs Immature Granulocytes: 0.06 10*3/uL (ref 0.00–0.07)
Basophils Absolute: 0 10*3/uL (ref 0.0–0.1)
Basophils Relative: 0 %
Eosinophils Absolute: 0.2 10*3/uL (ref 0.0–0.5)
Eosinophils Relative: 2 %
HCT: 39 % (ref 36.0–46.0)
Hemoglobin: 12.2 g/dL (ref 12.0–15.0)
Immature Granulocytes: 1 %
Lymphocytes Relative: 25 %
Lymphs Abs: 2.6 10*3/uL (ref 0.7–4.0)
MCH: 27.6 pg (ref 26.0–34.0)
MCHC: 31.3 g/dL (ref 30.0–36.0)
MCV: 88.2 fL (ref 80.0–100.0)
Monocytes Absolute: 0.6 10*3/uL (ref 0.1–1.0)
Monocytes Relative: 6 %
Neutro Abs: 6.8 10*3/uL (ref 1.7–7.7)
Neutrophils Relative %: 66 %
Platelets: 278 10*3/uL (ref 150–400)
RBC: 4.42 MIL/uL (ref 3.87–5.11)
RDW: 12.7 % (ref 11.5–15.5)
WBC: 10.3 10*3/uL (ref 4.0–10.5)
nRBC: 0 % (ref 0.0–0.2)

## 2019-05-01 LAB — COMPREHENSIVE METABOLIC PANEL
ALT: 19 U/L (ref 0–44)
AST: 26 U/L (ref 15–41)
Albumin: 3.8 g/dL (ref 3.5–5.0)
Alkaline Phosphatase: 69 U/L (ref 38–126)
Anion gap: 9 (ref 5–15)
BUN: 10 mg/dL (ref 6–20)
CO2: 25 mmol/L (ref 22–32)
Calcium: 8.7 mg/dL — ABNORMAL LOW (ref 8.9–10.3)
Chloride: 103 mmol/L (ref 98–111)
Creatinine, Ser: 0.75 mg/dL (ref 0.44–1.00)
GFR calc Af Amer: >60 mL/min (ref >60)
GFR calc non Af Amer: >60 mL/min (ref >60)
Glucose, Bld: 96 mg/dL (ref 70–99)
Potassium: 3.4 mmol/L — ABNORMAL LOW (ref 3.5–5.1)
Sodium: 137 mmol/L (ref 135–145)
Total Bilirubin: 0.4 mg/dL (ref 0.3–1.2)
Total Protein: 7.5 g/dL (ref 6.5–8.1)

## 2019-05-01 LAB — URINALYSIS, ROUTINE W REFLEX MICROSCOPIC
Bilirubin Urine: NEGATIVE
Glucose, UA: NEGATIVE mg/dL
Ketones, ur: NEGATIVE mg/dL
Nitrite: NEGATIVE
Protein, ur: NEGATIVE mg/dL
Specific Gravity, Urine: 1.025 (ref 1.005–1.030)
pH: 5.5 (ref 5.0–8.0)

## 2019-05-01 LAB — URINALYSIS, MICROSCOPIC (REFLEX)

## 2019-05-01 LAB — HCG, QUANTITATIVE, PREGNANCY: hCG, Beta Chain, Quant, S: 160 m[IU]/mL — ABNORMAL HIGH (ref <5)

## 2019-05-01 LAB — PREGNANCY, URINE: Preg Test, Ur: POSITIVE — AB

## 2019-05-01 MED ORDER — NITROFURANTOIN MONOHYD MACRO 100 MG PO CAPS
100.0000 mg | ORAL_CAPSULE | Freq: Once | ORAL | Status: AC
  Administered 2019-05-01: 100 mg via ORAL
  Filled 2019-05-01: qty 1

## 2019-05-01 MED ORDER — SODIUM CHLORIDE 0.9 % IV BOLUS
1000.0000 mL | Freq: Once | INTRAVENOUS | Status: AC
  Administered 2019-05-01: 17:00:00 1000 mL via INTRAVENOUS

## 2019-05-01 MED ORDER — ONDANSETRON HCL 4 MG/2ML IJ SOLN
4.0000 mg | Freq: Once | INTRAMUSCULAR | Status: AC
  Administered 2019-05-01: 4 mg via INTRAVENOUS
  Filled 2019-05-01: qty 2

## 2019-05-01 MED ORDER — NITROFURANTOIN MONOHYD MACRO 100 MG PO CAPS: 100.0000 mg | ORAL_CAPSULE | Freq: Two times a day (BID) | ORAL | 0 refills | Status: AC

## 2019-05-01 NOTE — ED Provider Notes (Signed)
[facility] EMERGENCY DEPARTMENT Provider Note   CSN: [id number] Arrival date & time: 05/01/19  1622     History   Chief Complaint Chief Complaint  Patient presents with   Abdominal Pain    HPI Shannon Mckee is a 33 y.o. female history of PCOS, here presenting with positive pregnancy test, vaginal spotting .  Patient states that she has irregular periods due to PCOS.  She states that she may have a lighter period on 8/11.  She had some nausea several days ago and checked a home pregnancy test 5 times and had all 5 positive pregnancy tests.  She states that for the last 2 to 3 days she has some vaginal spotting and some lower abdominal cramps.  She did not have an ultrasound for this baby yet so was sent here in for rule out ectopic pregnancy.  Denies any fevers or chills or vaginal discharge. She is sexually active with husband only.      The history is provided by the patient.    Past Medical History:  Diagnosis Date   Anemia    Pregnancy related   Anxiety    Asthma    as child and teenager. no problmes for 6 years.   Fibromyalgia    Hashimotos thyroiditis    History of PCOS     Patient Active Problem List   Diagnosis Date Noted   Gestational hypertension affecting third pregnancy 08/27/2016   LGA (large for gestational age) fetus affecting management of mother, third trimester, other fetus 08/24/2016   Maternal obesity, antepartum 07/31/2016   Thyroid dysfunction in pregnancy, antepartum 05/24/2016   Supervision of high risk pregnancy, antepartum 01/19/2016   Thyroid nodule 01/16/2016   Vitamin D deficiency 01/16/2016   Hashimoto's thyroiditis 09/23/2015   PCO (polycystic ovaries) 09/23/2015   Fibromyalgia 09/19/2015   Cervical paraspinal muscle spasm 09/19/2015    Past Surgical History:  Procedure Laterality Date   ANKLE SURGERY     x 2    APPENDECTOMY     CHOLECYSTECTOMY     TONSILLECTOMY AND ADENOIDECTOMY       OB  History    Gravida  3   Para  3   Term  3   Preterm      AB      Living  3     SAB      TAB      Ectopic      Multiple  0   Live Births  3            Home Medications    Prior to Admission medications   Medication Sig Start Date End Date Taking? Authorizing Provider  LATUDA 40 MG TABS tablet TK 1 T PO WITH SUPPER. WITH 350 CAL DIET 03/26/19  Yes [provider]  ALPRAZolam (XANAX) 0.5 MG tablet Take 1 tablet (0.5 mg total) by mouth 2 (two) times daily as needed for anxiety. Patient not taking: Reported on 08/30/2018 10/21/17   Saguier, Ramon Dredge, PA-C    Family History No family history on file.  Social History Social History   Tobacco Use   Smoking status: Never Smoker   Smokeless tobacco: Never Used  Substance Use Topics   Alcohol use: No    Alcohol/week: 0.0 standard drinks   Drug use: No     Allergies   Aspirin, Cefixime, Latex, and Sulfa antibiotics   Review of Systems Review of Systems  Gastrointestinal: Positive for abdominal pain.  All other systems reviewed  and are negative.    Physical Exam Updated Vital Signs BP (!) 154/81 (BP Location: Left Arm)    Pulse 94    Temp 99.2 F (37.3 C) (Oral)    Resp 20    Ht 5\' 4"  (1.626 m)    Wt 119.3 kg    LMP 04/14/2019    SpO2 100%    BMI 45.14 kg/m   Physical Exam Vitals signs reviewed.  HENT:     Head: Normocephalic.     Mouth/Throat:     Mouth: Mucous membranes are moist.  Eyes:     Extraocular Movements: Extraocular movements intact.  Cardiovascular:     Rate and Rhythm: Normal rate and regular rhythm.     Heart sounds: Normal heart sounds.  Pulmonary:     Effort: Pulmonary effort is normal.     Breath sounds: Normal breath sounds.  Abdominal:     General: Abdomen is flat. Bowel sounds are normal.     Comments: Minimal lower abdominal vs LLQ tenderness   Genitourinary:    Comments: Deferred  Skin:    General: Skin is warm.  Neurological:     General: No focal  deficit present.     Mental Status: She is alert.  Psychiatric:        Mood and Affect: Mood normal.        Behavior: Behavior normal.      ED Treatments / Results  Labs (all labs ordered are listed, but only abnormal results are displayed) Labs Reviewed  URINALYSIS, ROUTINE W REFLEX MICROSCOPIC - Abnormal; Notable for the following components:      Result Value   Hgb urine dipstick LARGE (*)    Leukocytes,Ua MODERATE (*)    All other components within normal limits  PREGNANCY, URINE - Abnormal; Notable for the following components:   Preg Test, Ur POSITIVE (*)    All other components within normal limits  COMPREHENSIVE METABOLIC PANEL - Abnormal; Notable for the following components:   Potassium 3.4 (*)    Calcium 8.7 (*)    All other components within normal limits  HCG, QUANTITATIVE, PREGNANCY - Abnormal; Notable for the following components:   hCG, Beta Chain, Quant, S 160 (*)    All other components within normal limits  URINALYSIS, MICROSCOPIC (REFLEX) - Abnormal; Notable for the following components:   Bacteria, UA MANY (*)    All other components within normal limits  URINE CULTURE  CBC WITH DIFFERENTIAL/PLATELET    EKG None  Radiology US Ob Less Than 14 Weeks With Ob Transvaginal  Result Date: 05/01/2019 CLINICAL DATA:  Left lower quadrant pain, vaginal bleeding EXAM: OBSTETRIC <14 WK [place] AND TRANSVAGINAL OB US TECHNIQUE: Both transabdominal and transvaginal ultrasound examinations were performed for complete evaluation of the gestation as well as the maternal uterus, adnexal regions, and pelvic cul-de-sac. Transvaginal technique was performed to assess early pregnancy. COMPARISON:  None. FINDINGS: Intrauterine gestational sac: None Yolk sac:  Not visualized Embryo:  Not visualized Cardiac Activity: Heart Rate:   bpm MSD:   mm    w     d CRL:    mm    w    d                  [place] EDC: Subchorionic hemorrhage:  None visualized. Maternal uterus/adnexae: No adnexal mass.  Trace free fluid in the pelvis. IMPRESSION: No intrauterine pregnancy visualized. Differential considerations would include early intrauterine pregnancy too early to visualize, spontaneous abortion, or occult  ectopic pregnancy. Recommend close clinical followup and serial quantitative beta HCGs and ultrasounds. Electronically Signed   By: Charlett NoseKevin  Dover M.D.   On: 05/01/2019 18:58    Procedures Procedures (including critical care time)  Medications Ordered in ED Medications  nitrofurantoin (macrocrystal-monohydrate) (MACROBID) capsule 100 mg (has no administration in time range)  sodium chloride 0.9 % bolus 1,000 mL (0 mLs Intravenous Stopped 05/01/19 1820)  ondansetron (ZOFRAN) injection 4 mg (4 mg Intravenous Given 05/01/19 1653)     Initial Impression / Assessment and Plan / ED Course  I have reviewed the triage vital signs and the nursing notes.  Pertinent labs & imaging results that were available during my care of the patient were reviewed by me and considered in my medical decision making (see chart for details).       Shannon DecampYolanda Mckee is a 33 y.o. female here with first trimester vaginal bleeding .She has no confirmed IUP yet so we will get hCG quant as well as transvaginal ultrasound to rule out ectopic versus early IUP.   7:07 PM UA + bacteria but she has no symptoms. HCG is 160. US showed no IUP. I think likely early IUP vs ectopic. She will need HCG quant recheck in 2 days with OB.  Told her to pelvic rest for now.  Will also send patient home with Macrobid.   Final Clinical Impressions(s) / ED Diagnoses   Final diagnoses:  Vaginal bleeding    ED Discharge Orders    None       Charlynne PanderYao, Shadeed Colberg Hsienta, MD 05/01/19 Windell [id number]

## 2019-05-01 NOTE — Discharge Instructions (Signed)
Your HCG level is 160.   You need recheck in 2 days in OB office   You may have some spotting   Pelvic rest until your bleeding resolves   You have some bacteria in your urine. Take macrobid   See your OB doctor   Return to ER if you have severe pelvic pain, vomiting, passing large clots

## 2019-05-01 NOTE — ED Triage Notes (Signed)
Pt c/o left side abd pain, vaginal bleeding-sx started 8/23-pt states she +home preg tests-no OB appt yet-was advised to come to ED

## 2019-05-03 LAB — URINE CULTURE: Culture: NO GROWTH

## 2020-03-18 IMAGING — CT CT RENAL STONE PROTOCOL
2 of 4 series · 16 of 46 positions shown, 18 images · non-contrast
Comparison: None.

CLINICAL DATA: 32-year-old female with right flank pain. Concern
for kidney stone.

EXAM:
CT ABDOMEN AND PELVIS WITHOUT CONTRAST
TECHNIQUE: Multidetector CT imaging of the abdomen and pelvis was performed
following the standard protocol without IV contrast.

[Series 2: axial st · axial · 0.92mm/px · z∈[-633,-128]mm · 13 of 111 slices shown, 15 images]
[im 5/111  soft-tissue]
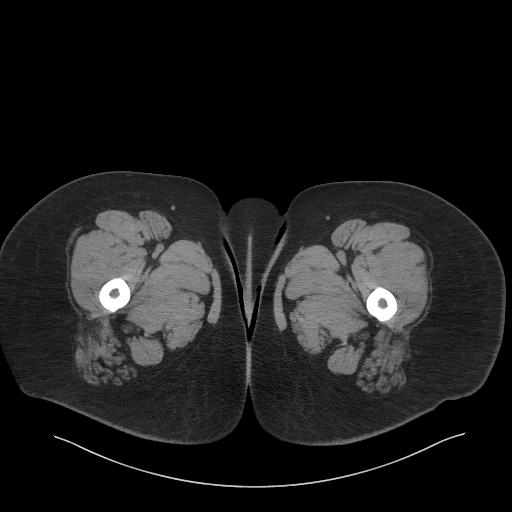
[im 5/111  bone]
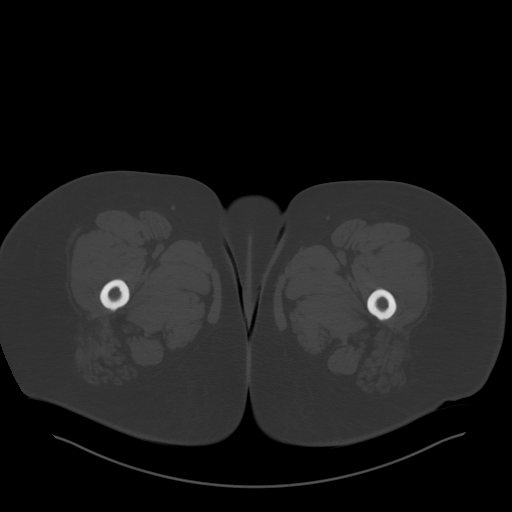
[im 14/111  soft-tissue]
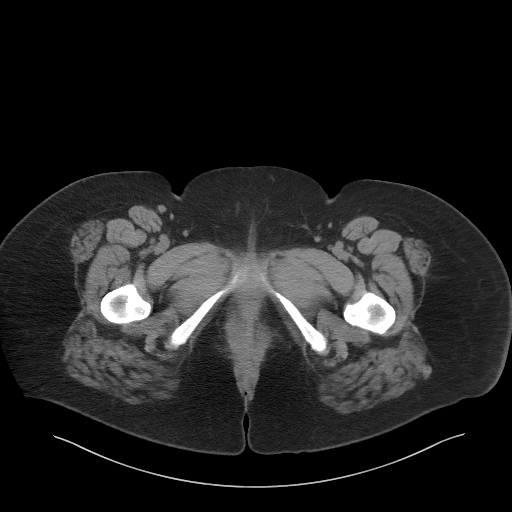
[im 23/111  soft-tissue]
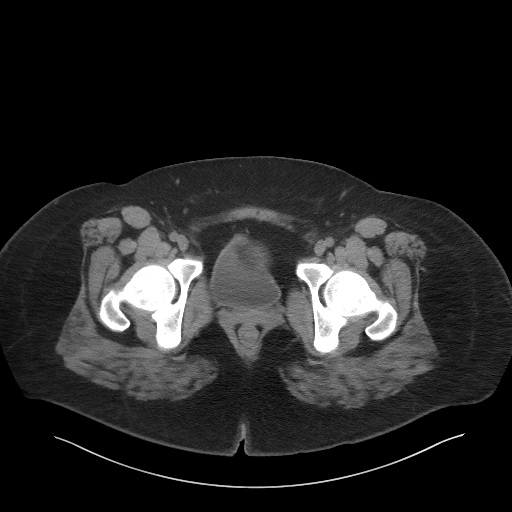
[im 33/111  soft-tissue]
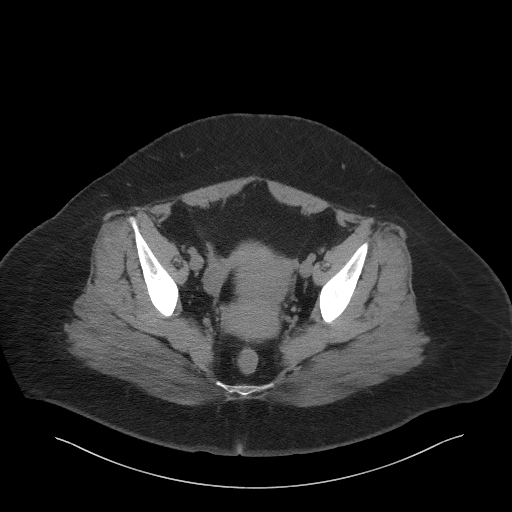
[im 37/111  soft-tissue]
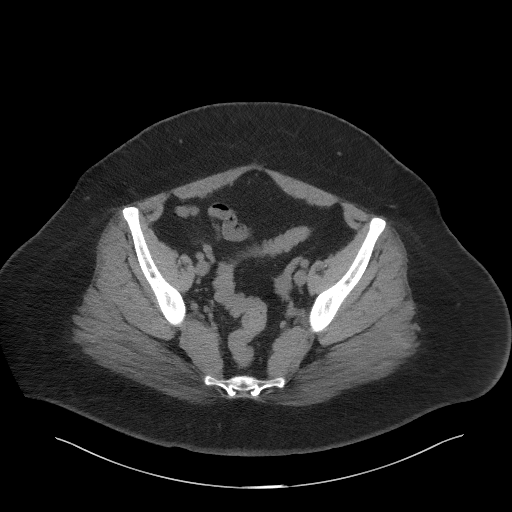
[im 46/111  soft-tissue]
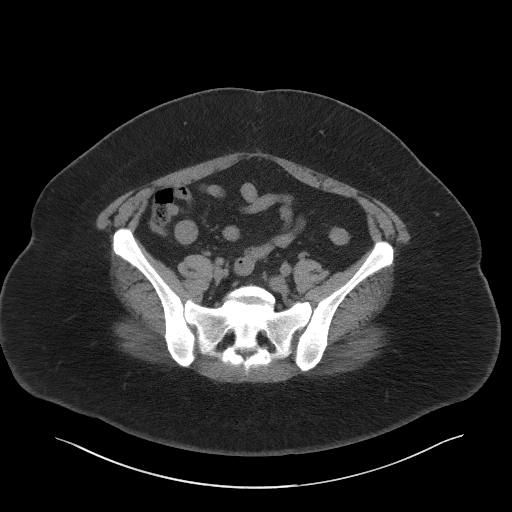
[im 56/111  soft-tissue]
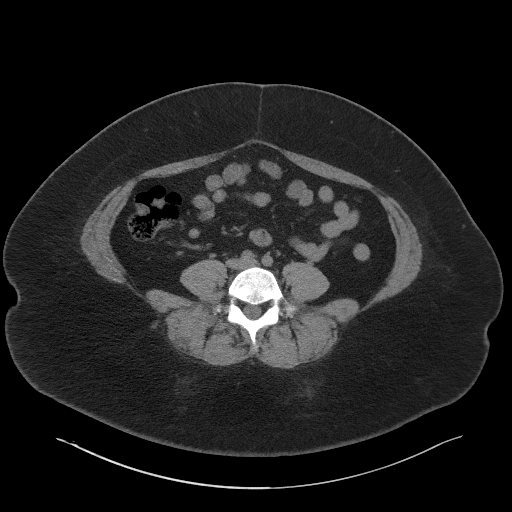
[im 65/111  soft-tissue]
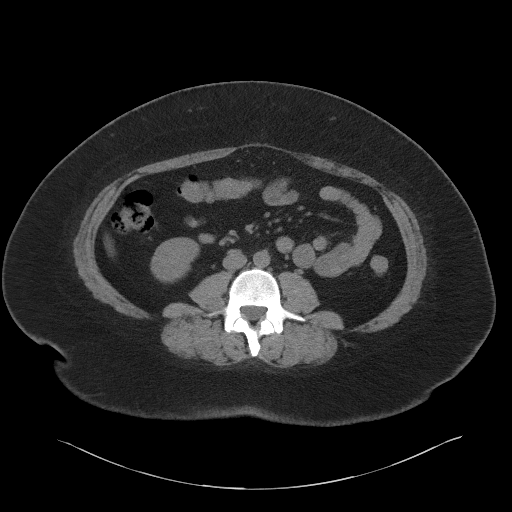
[im 74/111  soft-tissue]
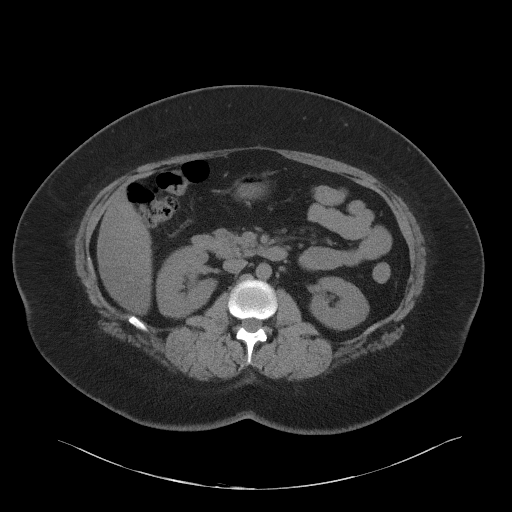
[im 74/111  bone]
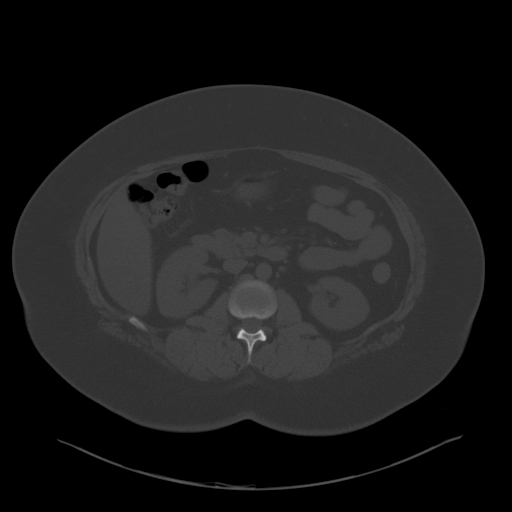
[im 78/111  soft-tissue]
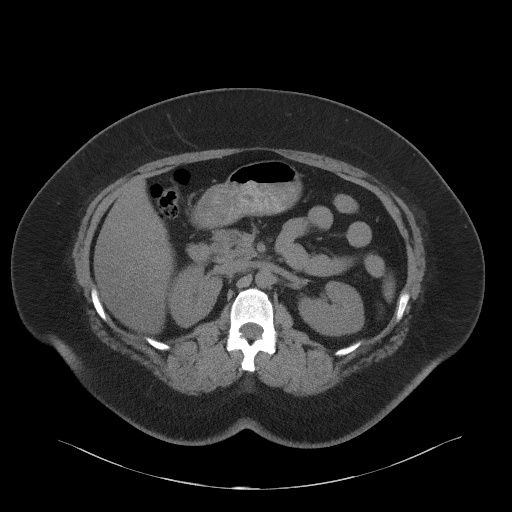
[im 88/111  soft-tissue]
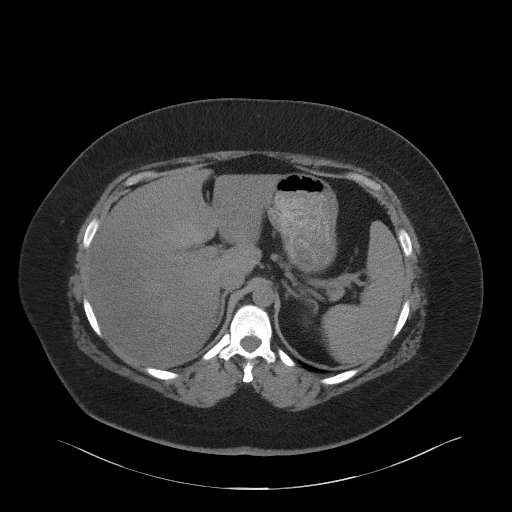
[im 97/111  soft-tissue]
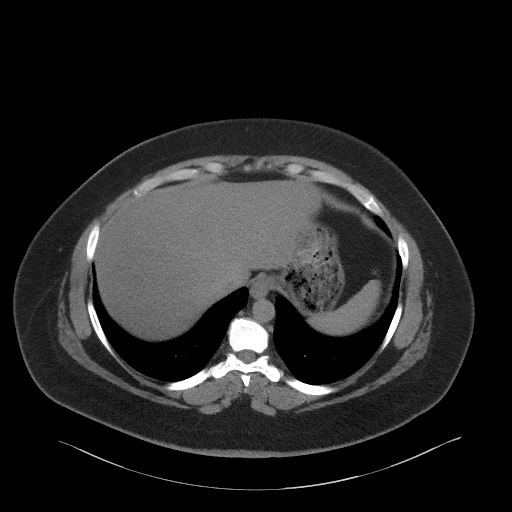
[im 106/111  soft-tissue]
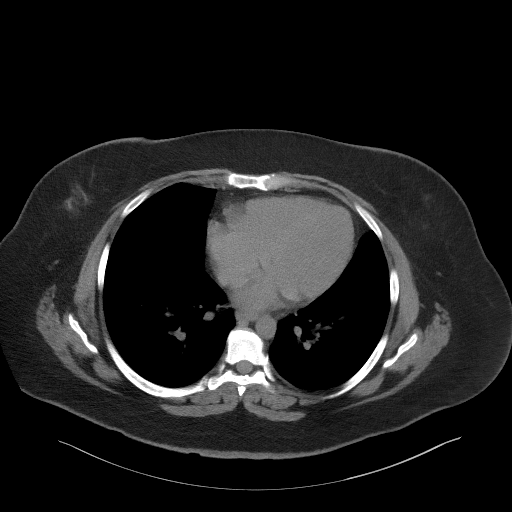

[Series 5: coronal st · coronal · 0.96mm/px · 3 of 105 slices shown]
[im 35/105  soft-tissue]
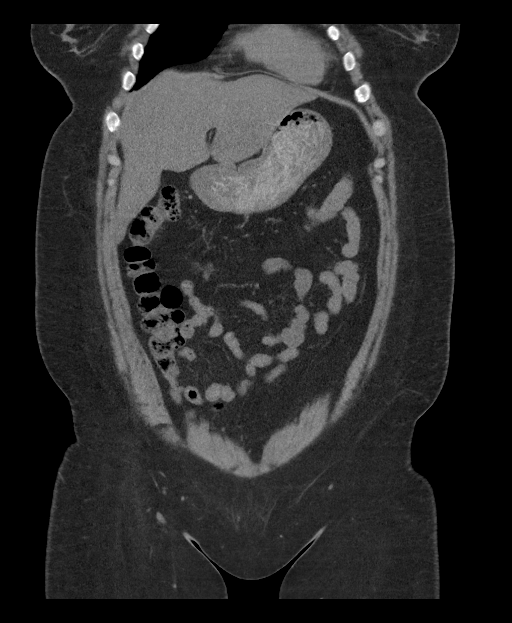
[im 47/105  soft-tissue]
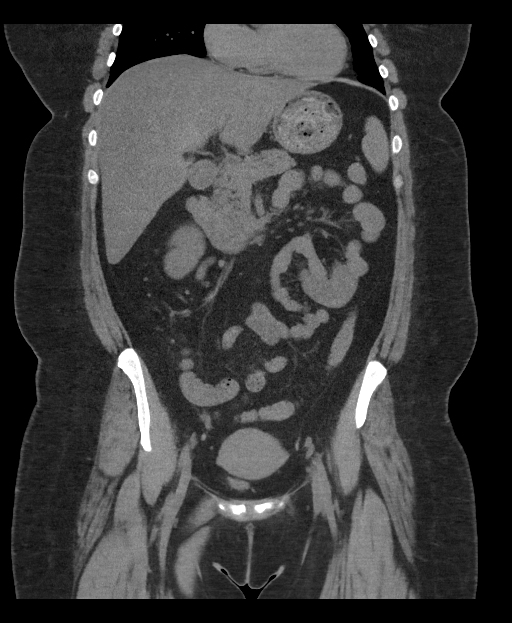
[im 58/105  soft-tissue]
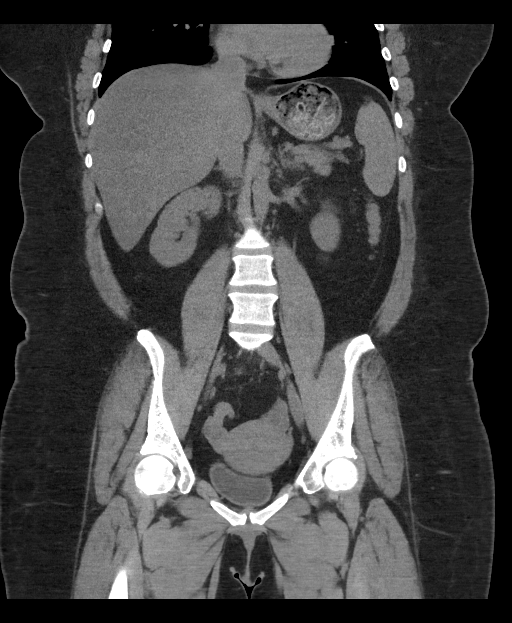

[16 of 46 positions shown; findings below may reference images not displayed]

FINDINGS: Evaluation of this exam is limited in the absence of intravenous
contrast.

Lower chest: The visualized lung bases are clear.

No intra-abdominal free air or free fluid.

Hepatobiliary: There is diffuse fatty infiltration of the liver.
Cholecystectomy. No intrahepatic biliary ductal dilatation. No
calcified stone noted in the central CBD.

Pancreas: Unremarkable. No pancreatic ductal dilatation or
surrounding inflammatory changes.

Spleen: Normal in size without focal abnormality.

Adrenals/Urinary Tract: Adrenal glands are unremarkable. Kidneys are
normal, without renal calculi, focal lesion, or hydronephrosis.
Bladder is unremarkable.

Stomach/Bowel: There is no bowel obstruction or active inflammation.
Appendectomy.

Vascular/Lymphatic: The abdominal aorta and IVC are unremarkable on
this noncontrast CT. No portal venous gas. There is no adenopathy.

Reproductive: The uterus and ovaries are grossly unremarkable. No
pelvic mass.

Other: None

Musculoskeletal: No acute or significant osseous findings.
IMPRESSION: 1. No acute intra-abdominopelvic pathology. Specifically there is no
hydronephrosis or nephrolithiasis.
2. Fatty infiltration of the liver.
3. No bowel obstruction or active inflammation.

## 2021-03-19 IMAGING — US OBSTETRIC <14 WK US AND TRANSVAGINAL OB US
1 series · 14 of 28 positions shown · non-contrast
Comparison: None.

CLINICAL DATA: Left lower quadrant pain, vaginal bleeding

EXAM:
OBSTETRIC <14 WK US AND TRANSVAGINAL OB US
TECHNIQUE: Both transabdominal and transvaginal ultrasound examinations were
performed for complete evaluation of the gestation as well as the
maternal uterus, adnexal regions, and pelvic cul-de-sac.
Transvaginal technique was performed to assess early pregnancy.

[Series 1: obstetric <14 wk us and transvaginal ob us · 38 acquisitions, 14 frames shown]
[im 2/38]
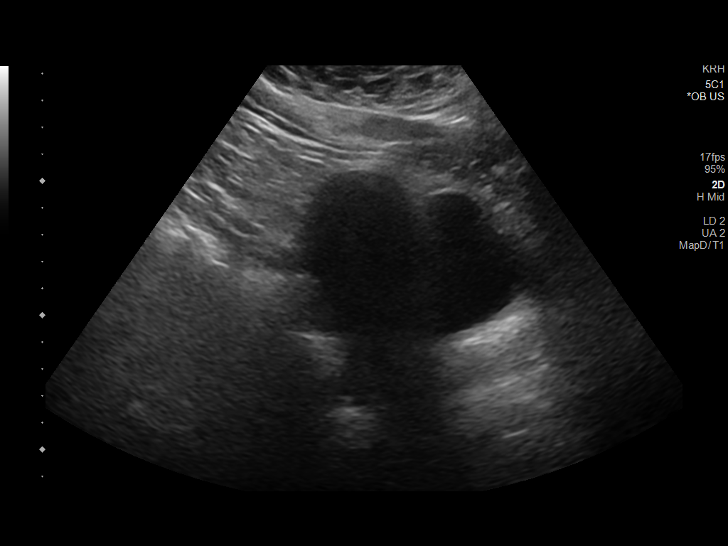
[im 5/38]
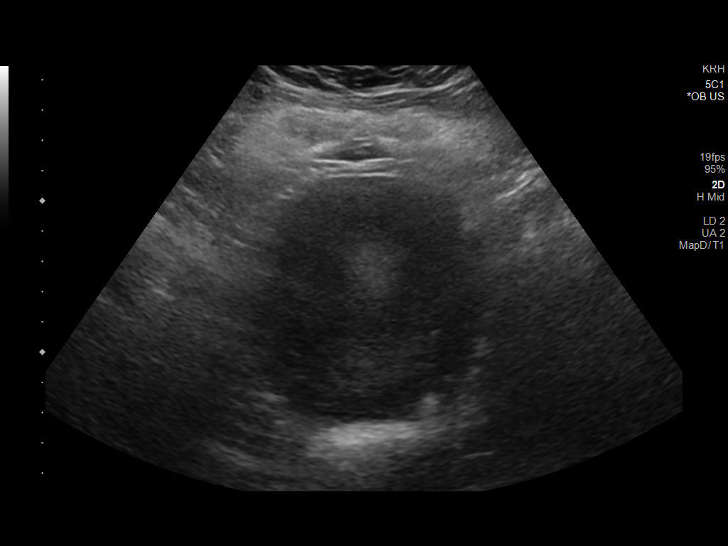
[im 7/38]
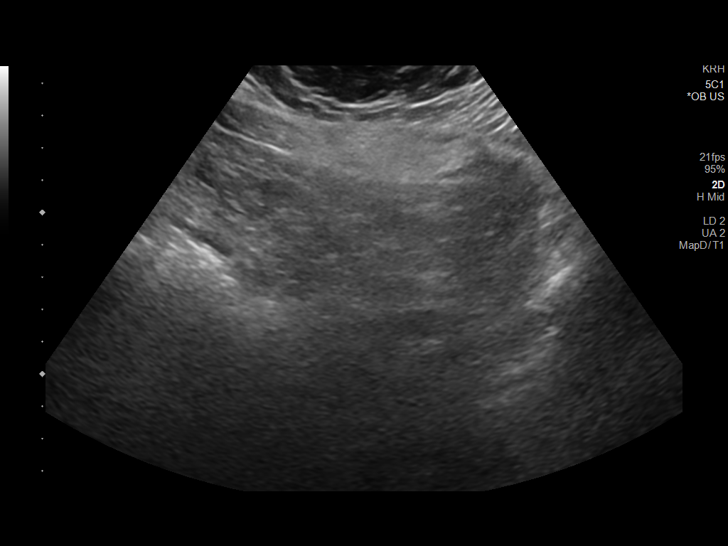
[im 10/38]
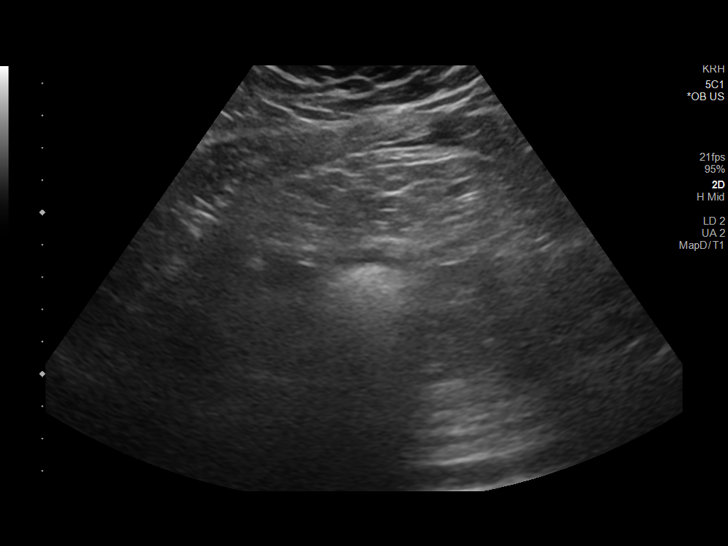
[im 13/38]
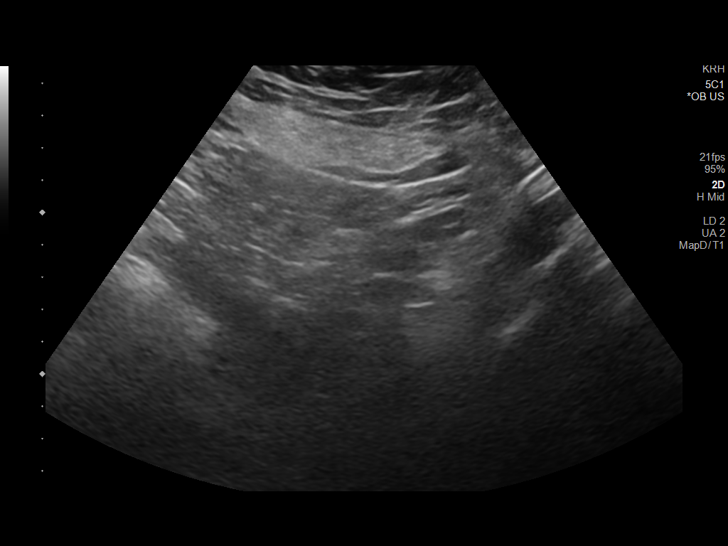
[im 16/38]
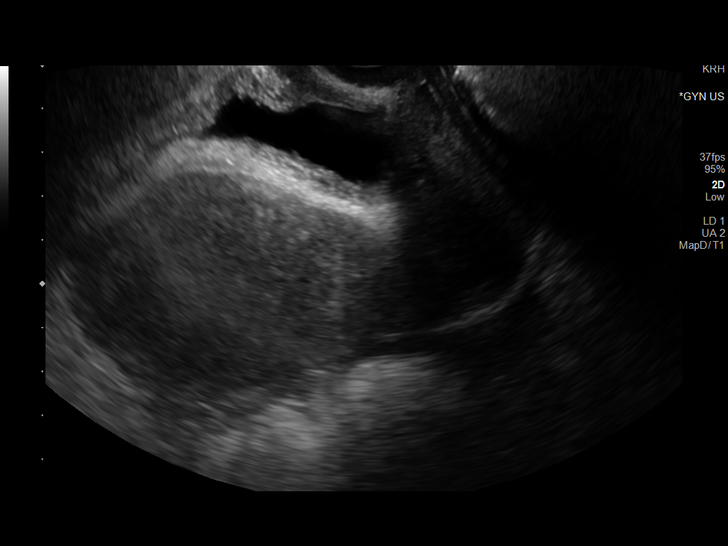
[im 18/38]
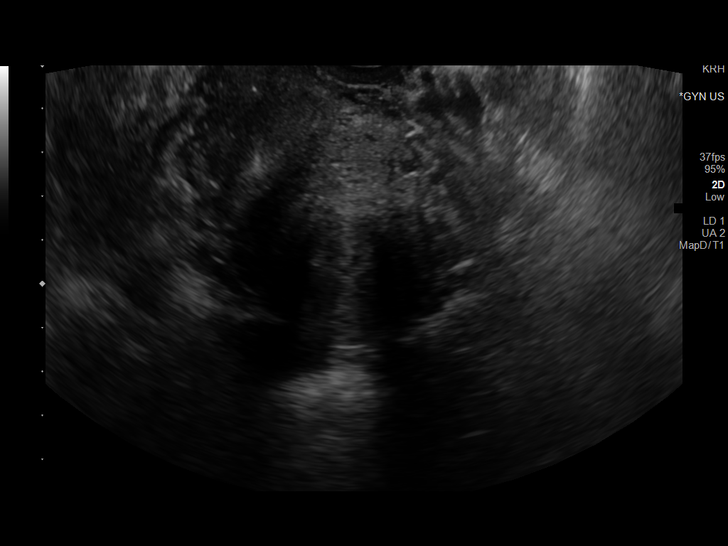
[im 21/38]
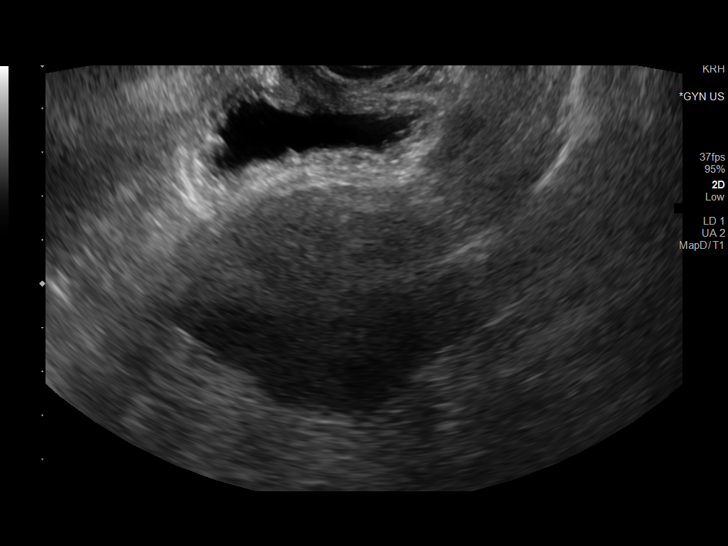
[im 24/38]
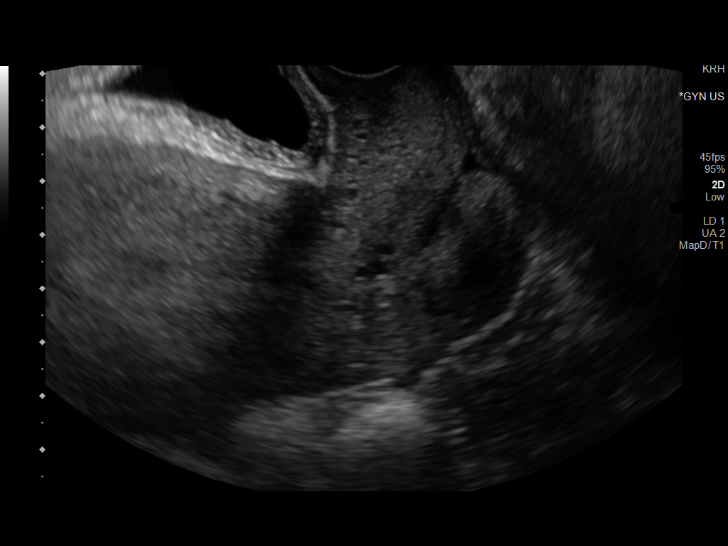
[im 27/38]
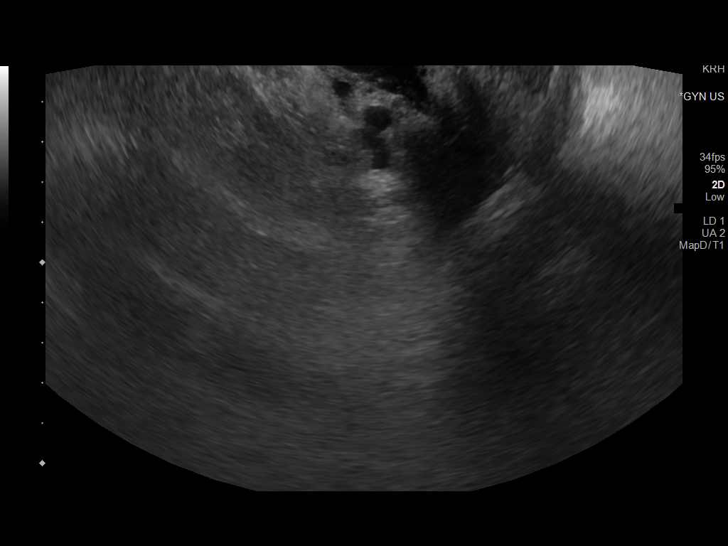
[im 29/38]
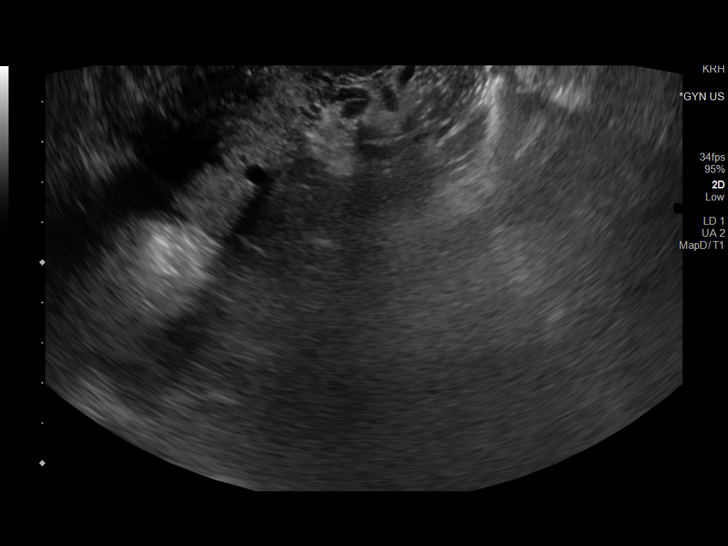
[im 32/38]
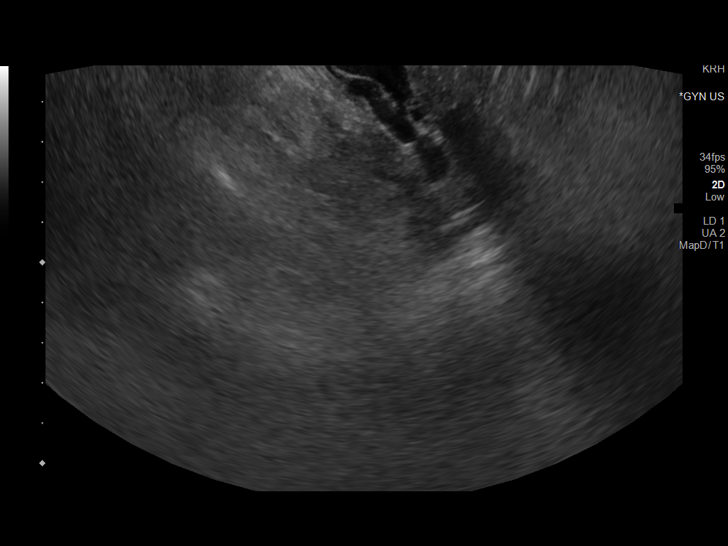
[im 35/38]
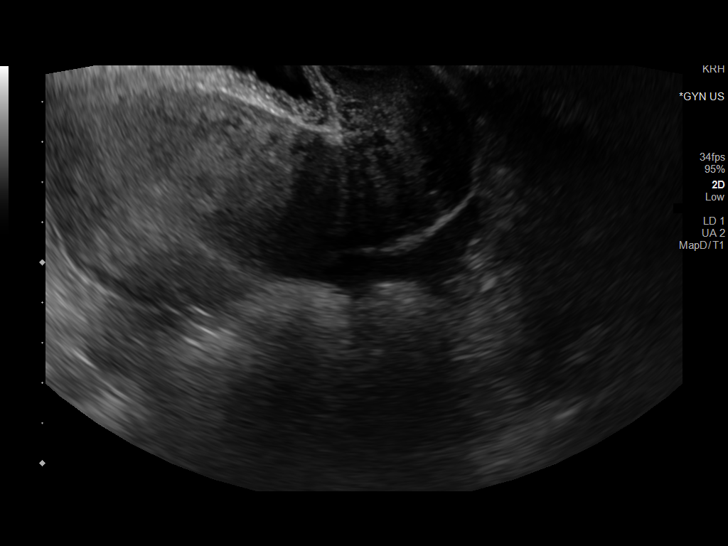
[im 38/38]
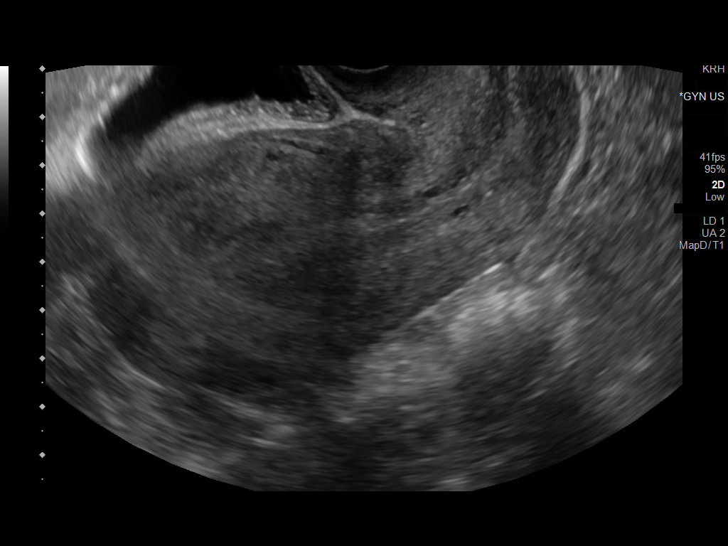

[14 of 28 positions shown; findings below may reference images not displayed]

FINDINGS: Intrauterine gestational sac: None

Yolk sac:  Not visualized

Embryo:  Not visualized

Cardiac Activity:

Heart Rate:   bpm

MSD:   mm    w     d

CRL:    mm    w    d                  US EDC:

Subchorionic hemorrhage:  None visualized.

Maternal uterus/adnexae: No adnexal mass. Trace free fluid in the
pelvis.
IMPRESSION: No intrauterine pregnancy visualized. Differential considerations
would include early intrauterine pregnancy too early to visualize,
spontaneous abortion, or occult ectopic pregnancy. Recommend close
clinical followup and serial quantitative beta HCGs and ultrasounds.

## 2023-01-28 ENCOUNTER — Encounter (HOSPITAL_BASED_OUTPATIENT_CLINIC_OR_DEPARTMENT_OTHER): Payer: Self-pay | Admitting: Emergency Medicine

## 2023-01-28 ENCOUNTER — Emergency Department (HOSPITAL_BASED_OUTPATIENT_CLINIC_OR_DEPARTMENT_OTHER)
Admission: EM | Admit: 2023-01-28 | Discharge: 2023-01-28 | Disposition: A | Discharge status: Home / Self Care | Payer: Medicaid Other | Attending: Emergency Medicine | Admitting: Emergency Medicine

## 2023-01-28 ENCOUNTER — Other Ambulatory Visit: Payer: Self-pay

## 2023-01-28 DIAGNOSIS — K0889 Other specified disorders of teeth and supporting structures: Secondary | ICD-10-CM

## 2023-01-28 DIAGNOSIS — J45909 Unspecified asthma, uncomplicated: Secondary | ICD-10-CM | POA: Insufficient documentation

## 2023-01-28 MED ORDER — CLINDAMYCIN HCL 150 MG PO CAPS: 450.0000 mg | ORAL_CAPSULE | Freq: Three times a day (TID) | ORAL | 0 refills | Status: AC

## 2023-01-28 NOTE — ED Notes (Signed)
Pt A&OX4 ambulatory at d/c with independent steady gait. Pt verbalized understanding of d/c instructions, prescription and follow up care. 

## 2023-01-28 NOTE — Discharge Instructions (Addendum)
You were seen in the ER today for evaluation of dental pain.  Likely was from the crack in your tooth.  Recommend plain orthodontic wax over the area to help with pain.  I am also to prescribe you a medication called Clindamycin which is an antibiotic.  Please take twice a day for the next week.  You can also try 600 mg of ibuprofen and 1000 mg of Tylenol every 6 hours as needed for pain.  I would try to avoid superhot or supercold foods as this may irritate your sensitivity.  Please make sure to call your dentist to see if they get you an earlier appointment.  If you have any concerns, new or worsening symptoms, please return to the nearest emergency department for evaluation.  Contact a dentist if: You have dental pain and you do not know why. Medicine does not help your pain. Your symptoms get worse. You have new symptoms. Get help right away if: You cannot open your mouth. You are having trouble breathing or swallowing. You have a fever. Your face, neck, or jaw is swollen. These symptoms may be an emergency. Get help right away. Call your local emergency services (911 in the [place] Do not wait to see if the symptoms will go away. Do not drive yourself to the hospital.

## 2023-01-28 NOTE — ED Triage Notes (Signed)
Pt c/o dental pain (LT upper); has an appt in July w/ dentist

## 2023-01-28 NOTE — ED Provider Notes (Signed)
[facility]GENCY DEPARTMENT AT [facility] Provider Note   CSN: [id number] Arrival date & time: 01/28/23  1534     History  Chief Complaint  Patient presents with   Dental Pain    Shannon Mckee is a 37 y.o. female with history of asthma, Hashimoto's, and prior myalgia presents to the emerged from today for evaluation of left upper tooth pain since Friday.  She reports has been broken for over a year and she has been seen by her dentist twice for this.  They did not offer any interventions given that it was not causing her pain.  However, now it is causing her pain.  She did have a appointment scheduled with her dentist however had to miss it because her child was sick.  She has a new dentist appointment in July.  She denies any fevers or trouble swallowing or any shortness of breath.  Denies any neck swelling or neck pain as well.  She is allergic to sulfa drugs.  Denies any tobacco, EtOH illicit drug use.   Dental Pain Associated symptoms: no fever and no neck pain        Home Medications Prior to Admission medications   Medication Sig Start Date End Date Taking? Authorizing Provider  ALPRAZolam Prudy Feeler) 0.5 MG tablet Take 1 tablet (0.5 mg total) by mouth 2 (two) times daily as needed for anxiety. Patient not taking: Reported on 08/30/2018 10/21/17   Saguier, Ramon Dredge, PA-C  LATUDA 40 MG TABS tablet TK 1 T PO WITH SUPPER. WITH 350 CAL DIET 03/26/19   [provider]  nitrofurantoin, macrocrystal-monohydrate, (MACROBID) 100 MG capsule Take 1 capsule (100 mg total) by mouth 2 (two) times daily. X 7 days 05/01/19   Charlynne Pander, MD      Allergies    Aspirin, Cefixime, Latex, and Sulfa antibiotics    Review of Systems   Review of Systems  Constitutional:  Negative for chills and fever.  HENT:  Positive for dental problem. Negative for sore throat and trouble swallowing.   Respiratory:  Negative for shortness of breath.   Musculoskeletal:  Negative for  neck pain.    Physical Exam Updated Vital Signs BP (!) 147/96 (BP Location: Right Arm)   Pulse 87   Temp 97.8 F (36.6 C)   Resp 18   Ht 5\' 4"  (1.626 m)   Wt 106.1 kg   LMP 04/14/2019   SpO2 96%   BMI 40.17 kg/m  Physical Exam Vitals and nursing note reviewed.  Constitutional:      General: She is not in acute distress.    Appearance: Normal appearance. She is not toxic-appearing.  HENT:     Head:     Comments: No facial swelling or crepitus appreciated.    Mouth/Throat:     Mouth: Mucous membranes are moist.     Comments: Small break noted in the more medial aspect of the upper left molar.  There is no gingival swelling or erythema.  No palpable induration or fluctuance.  No drainage noted.  Uvula is midline.  Airway is patent.  No sublingual elevation.  Moist mucous membranes.  Controlling secretions, normal speech.  No trismus. Eyes:     General: No scleral icterus. Pulmonary:     Effort: Pulmonary effort is normal. No respiratory distress.  Skin:    General: Skin is dry.     Findings: No rash.  Neurological:     General: No focal deficit present.     Mental  Status: She is alert. Mental status is at baseline.  Psychiatric:        Mood and Affect: Mood normal.     ED Results / Procedures / Treatments   Labs (all labs ordered are listed, but only abnormal results are displayed) Labs Reviewed - No data to display  EKG None  Radiology No results found.  Procedures Procedures   Medications Ordered in ED Medications - No data to display  ED Course/ Medical Decision Making/ A&P   Medical Decision Making  37 y.o. female presents to the ER today for evaluation of left upper dental pain. Differential diagnosis includes but is not limited to cavity, pulpitis, Ludwig, abscess. Vital signs show elevated blood pressure otherwise unremarkable. Physical exam as noted above.   Exam does not show any signs of Ludwig.  There is no sublingual elevation.  She is  controlling her secretions.  Uvula midline.  Airway patent.  She does have a small crack and chip noted in the more medial aspect of the left upper molar.  There is no surrounding erythema or gingival swelling.  No palpable induration or fluctuance noted.  She has normal speech.  Could be irritating chin to the nerve.  Recommended the patient use orthodontics max to place over the tooth.  Will also prescribe her some Clindamycin instead of Augmentin given an allergy list for a cephalosporin that she did not mention.  We discussed plan at bedside including antibiotics, pain management, and follow-up with dentist. We discussed strict return precautions and red flag symptoms. The patient verbalized their understanding and agrees to the plan. The patient is stable and being discharged home in good condition.  Portions of this report may have been transcribed using voice recognition software. Every effort was made to ensure accuracy; however, inadvertent computerized transcription errors may be present.   Final Clinical Impression(s) / ED Diagnoses Final diagnoses:  Pain, dental    Rx / DC Orders ED Discharge Orders          Ordered    clindamycin (CLEOCIN) 150 MG capsule  3 times daily        01/28/23 1618              Achille Rich, PA-C 01/28/23 1619    Ernie Avena, MD 01/28/23 2332

## 2024-06-01 ENCOUNTER — Other Ambulatory Visit: Payer: Self-pay | Admitting: Medical Genetics

## 2024-07-02 ENCOUNTER — Other Ambulatory Visit

## 2024-07-02 DIAGNOSIS — Z006 Encounter for examination for normal comparison and control in clinical research program: Secondary | ICD-10-CM

## 2024-07-11 LAB — GENECONNECT MOLECULAR SCREEN: Genetic Analysis Overall Interpretation: NEGATIVE
